# Patient Record
Sex: Female | Born: 1980 | Race: White | Hispanic: No | Smoking: Current every day smoker
Health system: Southern US, Community
[De-identification: ages and names within clinical notes are randomized; demographics above are authoritative.]

## PROBLEM LIST (undated history)

## (undated) DIAGNOSIS — R51 Headache: Secondary | ICD-10-CM

## (undated) DIAGNOSIS — M797 Fibromyalgia: Secondary | ICD-10-CM

## (undated) DIAGNOSIS — H539 Unspecified visual disturbance: Secondary | ICD-10-CM

## (undated) DIAGNOSIS — R519 Headache, unspecified: Secondary | ICD-10-CM

## (undated) HISTORY — PX: HEMANGIOMA EXCISION: SHX1734

## (undated) HISTORY — DX: Unspecified visual disturbance: H53.9

## (undated) HISTORY — PX: BACK SURGERY: SHX140

## (undated) HISTORY — DX: Headache, unspecified: R51.9

## (undated) HISTORY — PX: PARTIAL HYSTERECTOMY: SHX80

## (undated) HISTORY — DX: Fibromyalgia: M79.7

## (undated) HISTORY — DX: Headache: R51

---

## 2009-11-26 ENCOUNTER — Ambulatory Visit (HOSPITAL_COMMUNITY): Admission: RE | Admit: 2009-11-26 | Discharge: 2009-11-26 | Payer: Self-pay | Admitting: Neurosurgery

## 2010-12-08 LAB — SURGICAL PCR SCREEN: MRSA, PCR: NEGATIVE

## 2010-12-08 LAB — CBC
HCT: 32.8 % — ABNORMAL LOW (ref 36.0–46.0)
Hemoglobin: 10.8 g/dL — ABNORMAL LOW (ref 12.0–15.0)
MCV: 81.3 fL (ref 78.0–100.0)
Platelets: 194 10*3/uL (ref 150–400)
RDW: 13.4 % (ref 11.5–15.5)

## 2012-12-27 ENCOUNTER — Other Ambulatory Visit: Payer: Self-pay | Admitting: Neurosurgery

## 2012-12-27 DIAGNOSIS — M545 Low back pain: Secondary | ICD-10-CM

## 2012-12-27 DIAGNOSIS — M5417 Radiculopathy, lumbosacral region: Secondary | ICD-10-CM

## 2012-12-27 DIAGNOSIS — M47817 Spondylosis without myelopathy or radiculopathy, lumbosacral region: Secondary | ICD-10-CM

## 2013-01-12 ENCOUNTER — Ambulatory Visit
Admission: RE | Admit: 2013-01-12 | Discharge: 2013-01-12 | Disposition: A | Discharge status: Home / Self Care | Payer: Medicaid Other | Source: Ambulatory Visit | Attending: Neurosurgery | Admitting: Neurosurgery

## 2013-01-12 VITALS — BP 96/54 | HR 56 | Ht 68.0 in | Wt 115.0 lb

## 2013-01-12 DIAGNOSIS — M545 Low back pain: Secondary | ICD-10-CM

## 2013-01-12 DIAGNOSIS — M5417 Radiculopathy, lumbosacral region: Secondary | ICD-10-CM

## 2013-01-12 DIAGNOSIS — M47817 Spondylosis without myelopathy or radiculopathy, lumbosacral region: Secondary | ICD-10-CM

## 2013-01-12 MED ORDER — IOHEXOL 180 MG/ML  SOLN
17.0000 mL | Freq: Once | INTRAMUSCULAR | Status: AC | PRN
  Administered 2013-01-12: 17 mL via INTRATHECAL

## 2013-01-12 MED ORDER — DIAZEPAM 5 MG PO TABS
5.0000 mg | ORAL_TABLET | Freq: Once | ORAL | Status: AC
  Administered 2013-01-12: 5 mg via ORAL

## 2013-07-06 IMAGING — CT CT L SPINE W/ CM
4 of 10 series · 13 of 33 positions shown, 14 images · IV contrast (omnipaque)
Comparison: MR 07/03/2012 and earlier studies

Clincal data: Back and right lower extremity pain.  Previous disc
surgery.

LUMBAR MYELOGRAM
CT LUMBAR SPINE WITH INTRATHECAL CONTRAST
TECHNIQUE: An appropriate entry site was determined under
fluoroscopy. Operator donned sterile gloves and mask. Skin site was
marked, prepped with Betadine, and draped in usual sterile fashion,
and infiltrated locally with 1% lidocaine. A 22 gauge spinal needle
was  advanced into the thecal sac at L4 from a left interlaminar
approach. Clear colorless CSF returned. 17 ml Omnipaque 180 were
administered intrathecally for lumbar myelography, followed by
axial CT scanning of the lumbar spine. I personally performed the
lumbar puncture and administered the intrathecal contrast. I also
personally supervised acquisition of the myelogram images. Coronal
and sagittal reconstructions were generated from the axial scan.
Fluoroscopy time: 66seconds

[Series 2: l spine bone · axial · 0.27mm/px · z∈[-244,-166]mm · 2 of 95 slices shown, 3 images]
[im 32/95  soft-tissue]
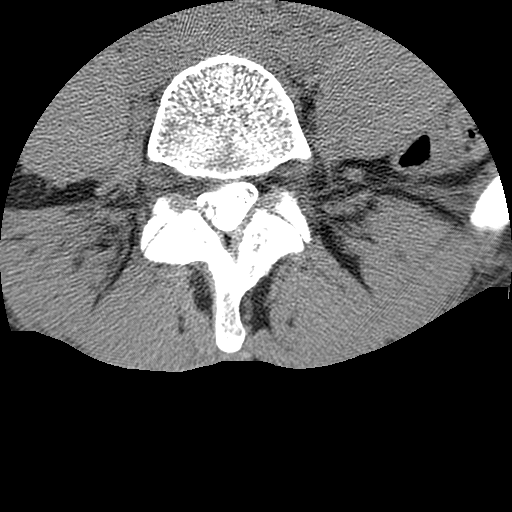
[im 32/95  bone]
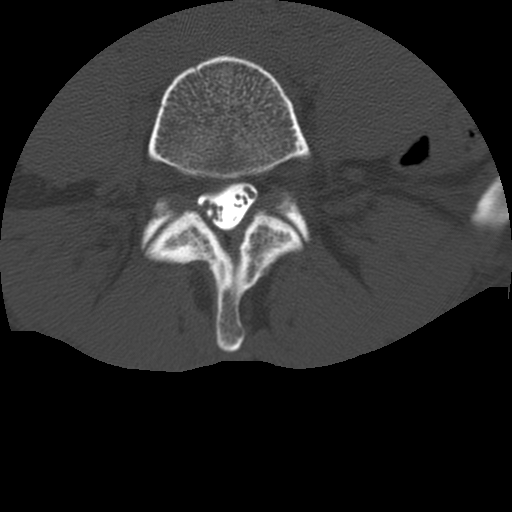
[im 63/95  bone]
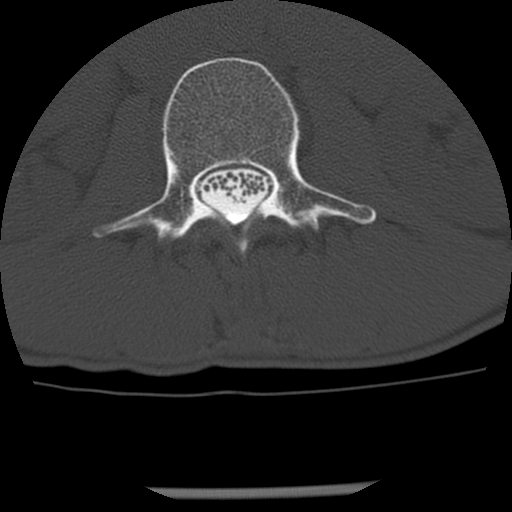

[Series 3: l spine soft · axial · 0.27mm/px · z∈[-264,-146]mm · 3 of 95 slices shown]
[im 24/95  soft-tissue]
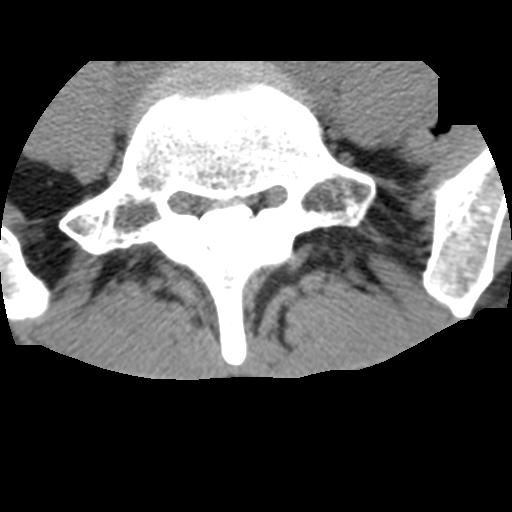
[im 48/95  soft-tissue]
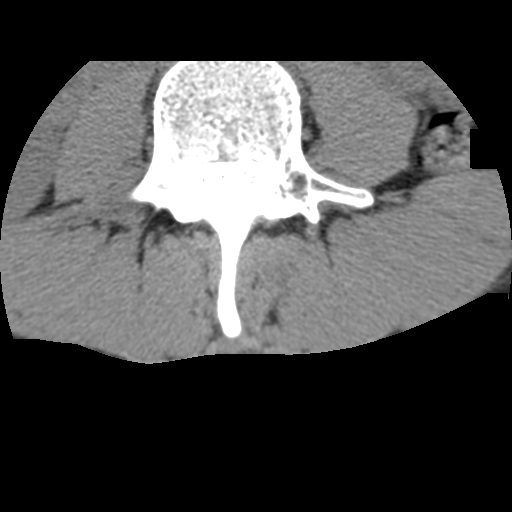
[im 71/95  soft-tissue]
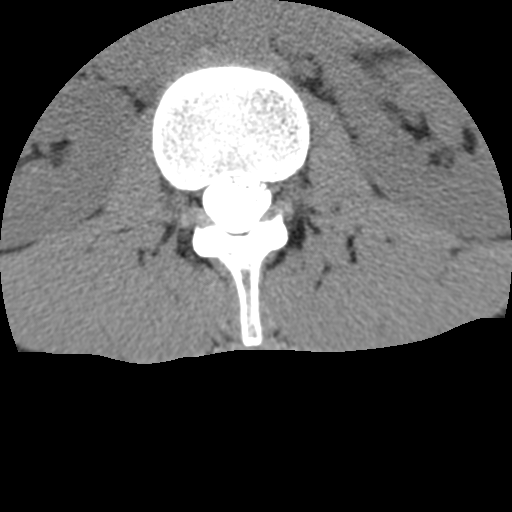

[Series 103: cor · coronal · 0.47mm/px · 5 of 44 slices shown]
[im 8/44  bone]
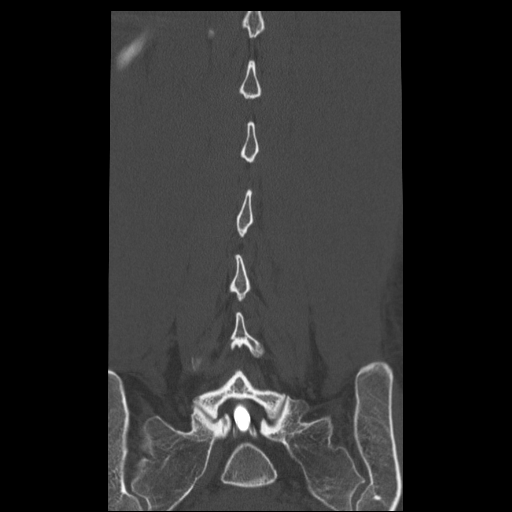
[im 15/44  bone]
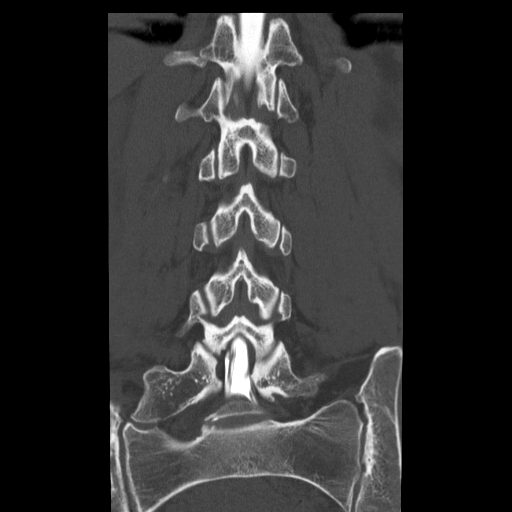
[im 22/44  bone]
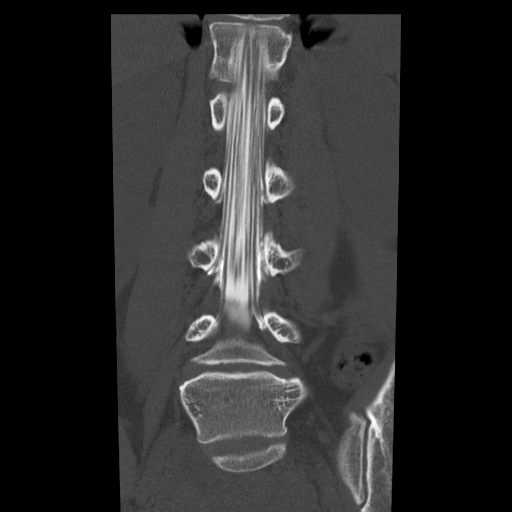
[im 29/44  bone]
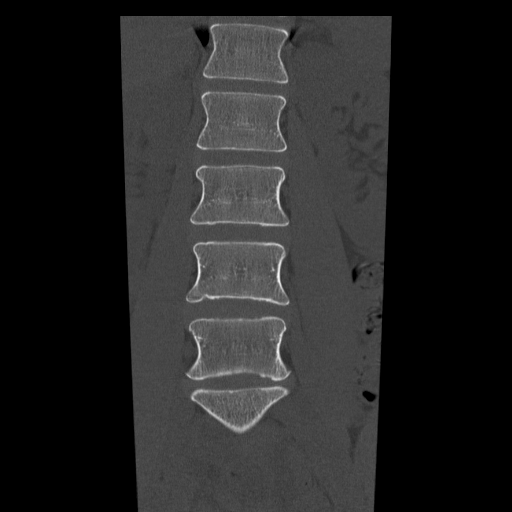
[im 36/44  bone]
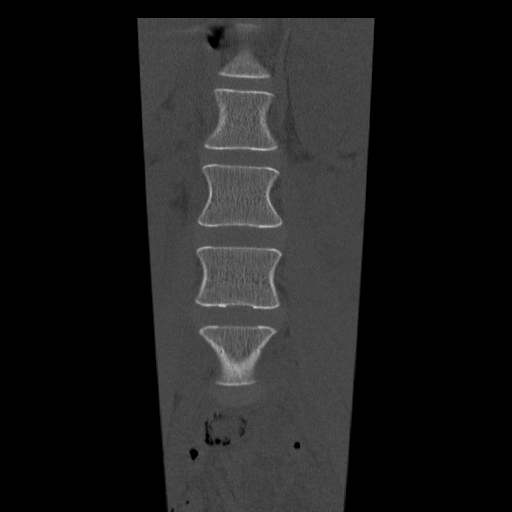

[Series 104: cor/lower · coronal · 0.27mm/px · 3 of 44 slices shown]
[im 9/44  bone]
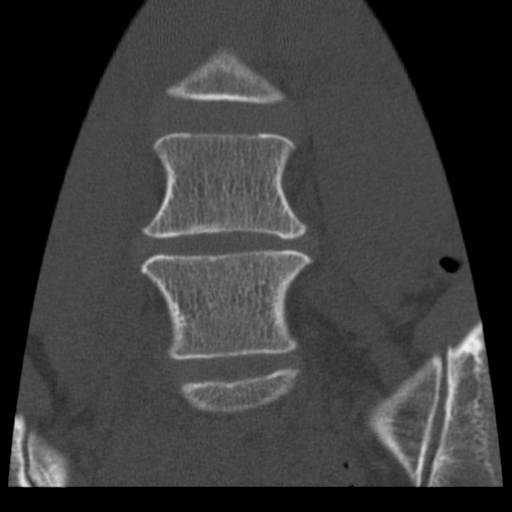
[im 18/44  bone]
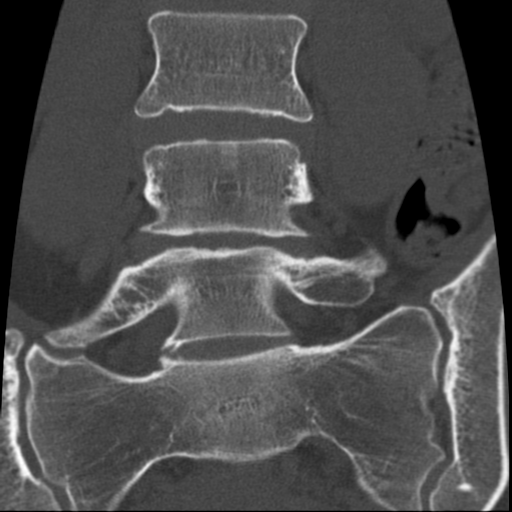
[im 26/44  bone]
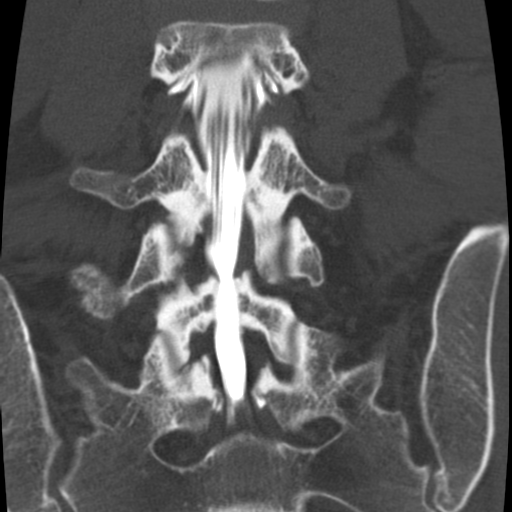

[13 of 33 positions shown; findings below may reference images not displayed]

FINDINGS: Numbering scheme follows that used on the prior exams.
Normal alignment.  No dynamic instability on its lateral flexion
and extension radiographs.

T12-L1:  Hypoplastic ribs at T12.  Interspace unremarkable.  Conus
terminates at the level of the interspace.  Central canal and
foramina patent.

L1-2: Unremarkable

L2-3: Unremarkable

L3-4: Mild disc bulge.  Central canal and foramina patent.

L4-5: Mild disc bulge and narrowing of the interspace.
Superimposed moderate right posterolateral  protrusion,
significantly displaces the proximal aspect of the right L5 nerve
root sleeve, and contributes to   right subarticular recess
stenosis.  Foramina remain patent.

L5-S1: Unremarkable interspace.  L5 is a transitional segment, the
enlarged right   transverse process articulating with the first
sacral segment.

Visualized paraspinal soft tissues unremarkable.  IUD noted.
IMPRESSION: 1. Moderate right posterolateral protrusion L4-5, with subarticular
recess stenosis and displacement of the proximal L5 nerve root
sleeve.

2.  Mild disc bulge L3-4 without compressive pathology.

## 2013-07-06 IMAGING — RF DG MYELOGRAM LUMBAR
14 of 16 series · 14 of 16 positions shown · IV contrast (omnipaque)
Comparison: MR 07/03/2012 and earlier studies

Clincal data: Back and right lower extremity pain.  Previous disc
surgery.

LUMBAR MYELOGRAM
CT LUMBAR SPINE WITH INTRATHECAL CONTRAST
TECHNIQUE: An appropriate entry site was determined under
fluoroscopy. Operator donned sterile gloves and mask. Skin site was
marked, prepped with Betadine, and draped in usual sterile fashion,
and infiltrated locally with 1% lidocaine. A 22 gauge spinal needle
was  advanced into the thecal sac at L4 from a left interlaminar
approach. Clear colorless CSF returned. 17 ml Omnipaque 180 were
administered intrathecally for lumbar myelography, followed by
axial CT scanning of the lumbar spine. I personally performed the
lumbar puncture and administered the intrathecal contrast. I also
personally supervised acquisition of the myelogram images. Coronal
and sagittal reconstructions were generated from the axial scan.
Fluoroscopy time: 66seconds

[Series 1: (hospital) · 1 of 1 slices shown]
[im 1/1]
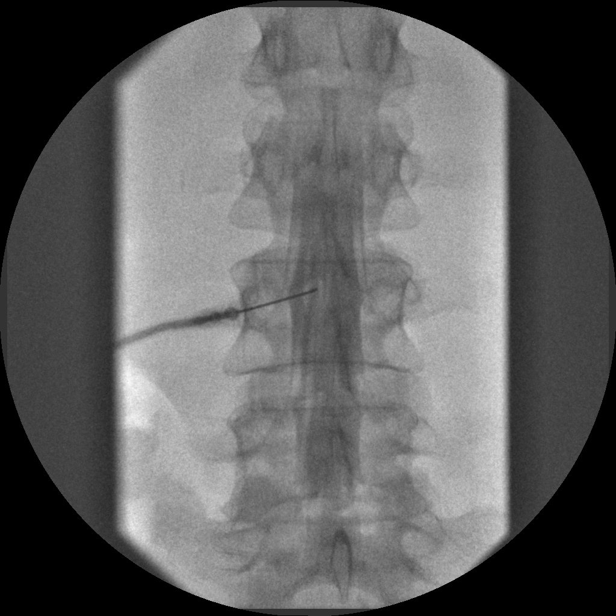

[Series 2: myelogram  white · 1 of 1 slices shown (1 of 10)]
[im 1/1]
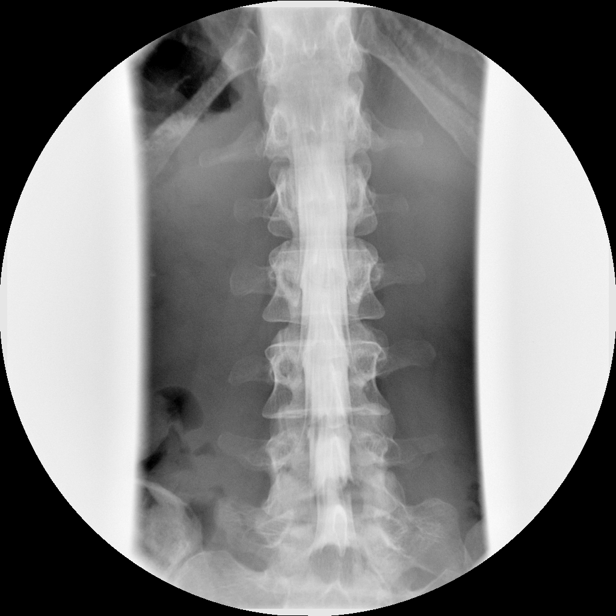

[Series 3: myelogram  white · 1 of 1 slices shown (2 of 10)]
[im 1/1]
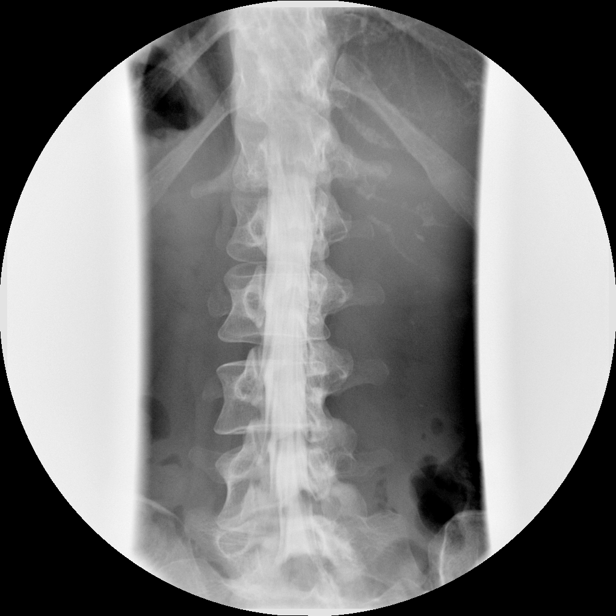

[Series 5: myelogram  white · 1 of 1 slices shown (3 of 10)]
[im 1/1]
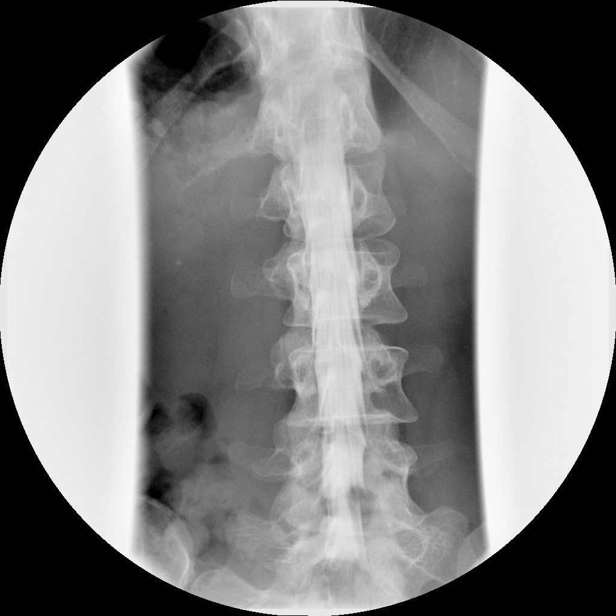

[Series 6: myelogram  white · 1 of 1 slices shown (4 of 10)]
[im 1/1]
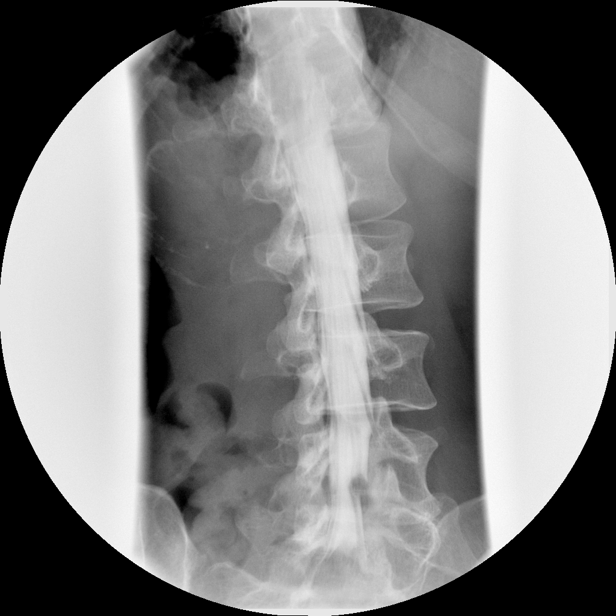

[Series 7: myelogram  white · 1 of 1 slices shown (5 of 10)]
[im 1/1]
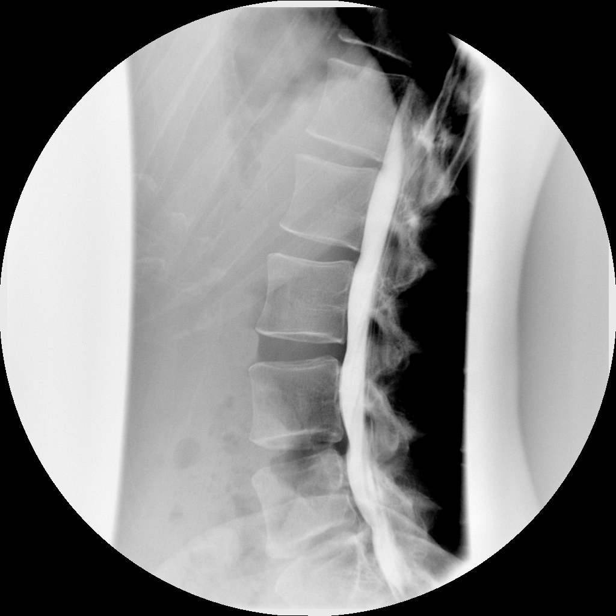

[Series 8: myelogram  white · 1 of 1 slices shown (6 of 10)]
[im 1/1]
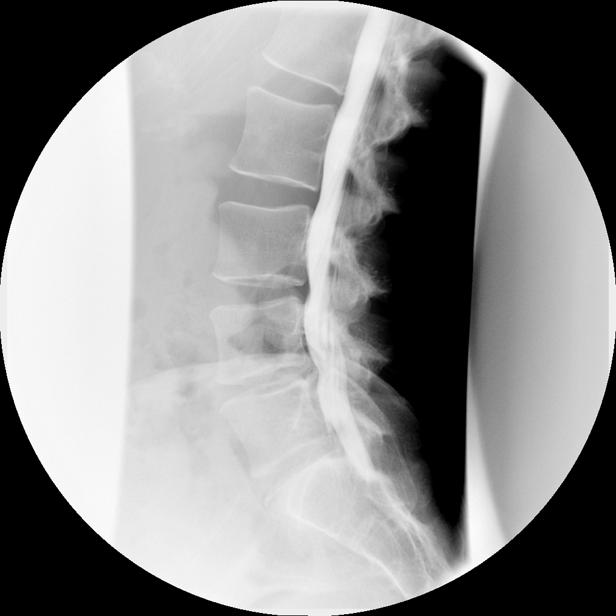

[Series 9: myelogram  white · 1 of 1 slices shown (7 of 10)]
[im 1/1]
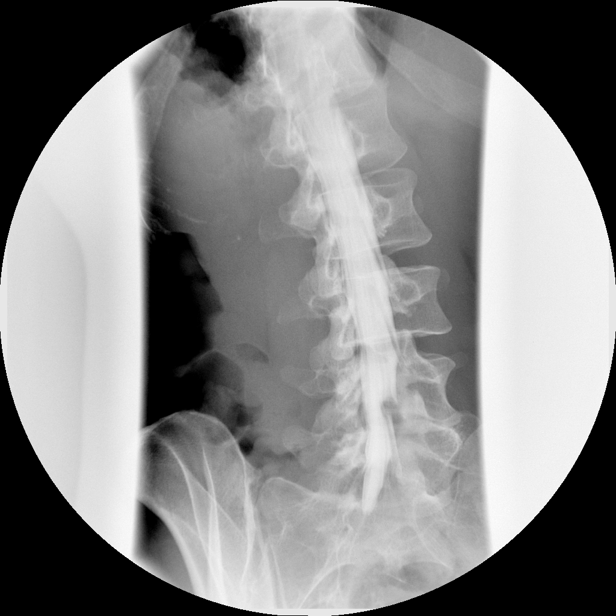

[Series 10: myelogram  white · 1 of 1 slices shown (8 of 10)]
[im 1/1]
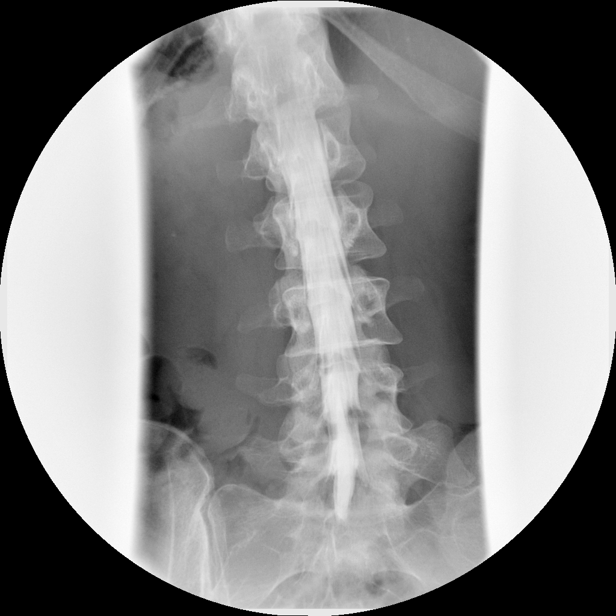

[Series 11: myelogram  white · 1 of 1 slices shown (9 of 10)]
[im 1/1]
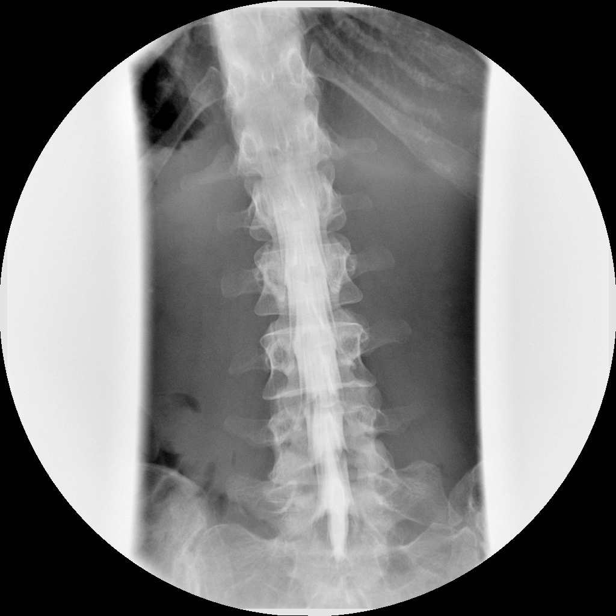

[Series 13: myelogram  white · 1 of 1 slices shown (10 of 10)]
[im 1/1]
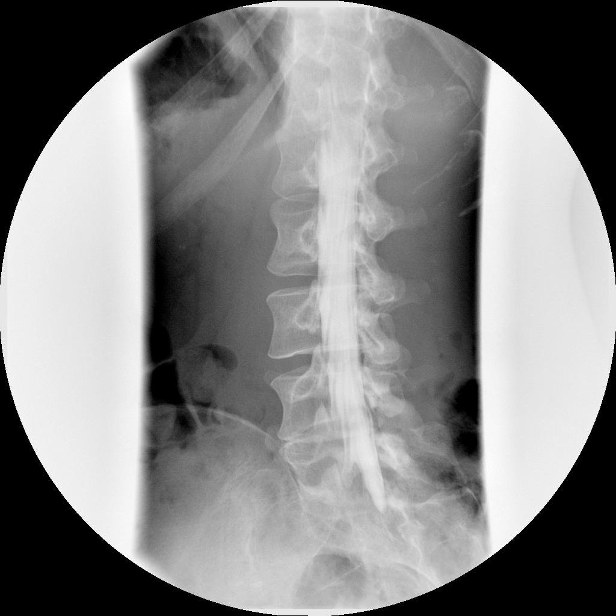

[Series 1001: view not recorded · 0.20mm/px · 1 of 1 slices shown (1 of 3)]
[im 1/1]
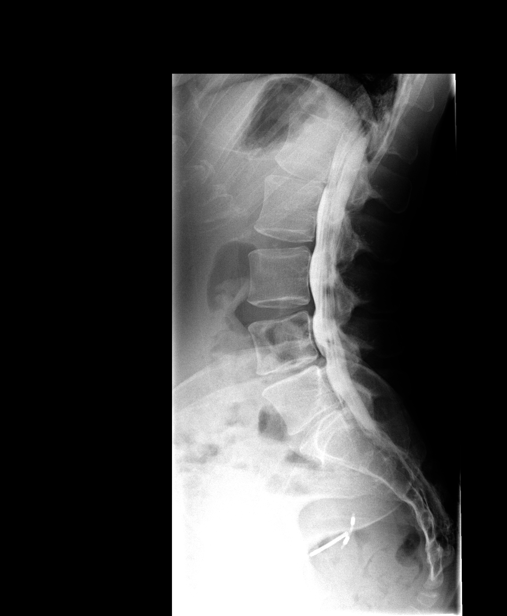

[Series 1002: view not recorded · 0.20mm/px · 1 of 1 slices shown (2 of 3)]
[im 1/1]
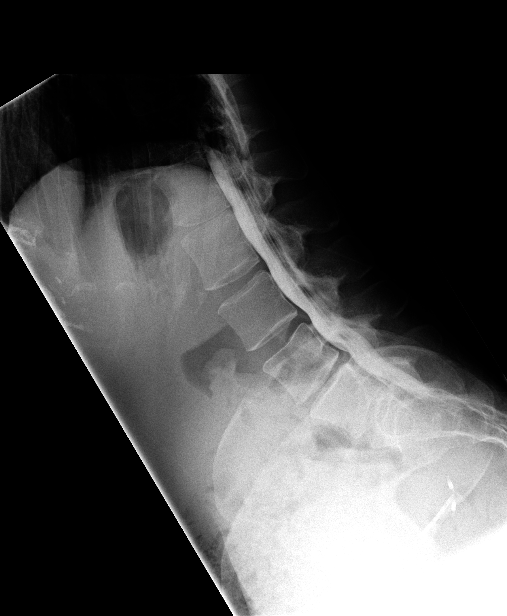

[Series 1003: view not recorded · 0.20mm/px · 1 of 1 slices shown (3 of 3)]
[im 1/1]
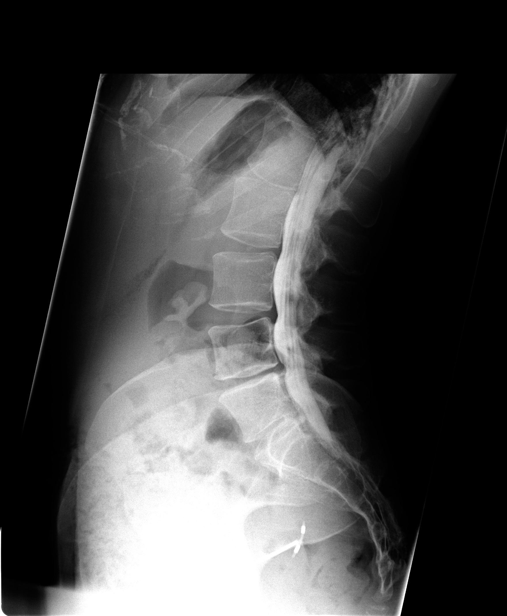

[14 of 16 positions shown; findings below may reference images not displayed]

FINDINGS: Numbering scheme follows that used on the prior exams.
Normal alignment.  No dynamic instability on its lateral flexion
and extension radiographs.

T12-L1:  Hypoplastic ribs at T12.  Interspace unremarkable.  Conus
terminates at the level of the interspace.  Central canal and
foramina patent.

L1-2: Unremarkable

L2-3: Unremarkable

L3-4: Mild disc bulge.  Central canal and foramina patent.

L4-5: Mild disc bulge and narrowing of the interspace.
Superimposed moderate right posterolateral  protrusion,
significantly displaces the proximal aspect of the right L5 nerve
root sleeve, and contributes to   right subarticular recess
stenosis.  Foramina remain patent.

L5-S1: Unremarkable interspace.  L5 is a transitional segment, the
enlarged right   transverse process articulating with the first
sacral segment.

Visualized paraspinal soft tissues unremarkable.  IUD noted.
IMPRESSION: 1. Moderate right posterolateral protrusion L4-5, with subarticular
recess stenosis and displacement of the proximal L5 nerve root
sleeve.

2.  Mild disc bulge L3-4 without compressive pathology.

## 2016-07-16 ENCOUNTER — Ambulatory Visit (INDEPENDENT_AMBULATORY_CARE_PROVIDER_SITE_OTHER): Payer: Medicaid Other | Admitting: Neurology

## 2016-07-16 ENCOUNTER — Encounter: Payer: Self-pay | Admitting: Neurology

## 2016-07-16 DIAGNOSIS — M797 Fibromyalgia: Secondary | ICD-10-CM

## 2016-07-16 DIAGNOSIS — I7300 Raynaud's syndrome without gangrene: Secondary | ICD-10-CM

## 2016-07-16 DIAGNOSIS — M25551 Pain in right hip: Secondary | ICD-10-CM | POA: Diagnosis not present

## 2016-07-16 DIAGNOSIS — G4700 Insomnia, unspecified: Secondary | ICD-10-CM | POA: Insufficient documentation

## 2016-07-16 DIAGNOSIS — R0683 Snoring: Secondary | ICD-10-CM | POA: Diagnosis not present

## 2016-07-16 DIAGNOSIS — F418 Other specified anxiety disorders: Secondary | ICD-10-CM

## 2016-07-16 DIAGNOSIS — G4719 Other hypersomnia: Secondary | ICD-10-CM | POA: Diagnosis not present

## 2016-07-16 MED ORDER — CYCLOBENZAPRINE HCL 5 MG PO TABS: 5.0000 mg | ORAL_TABLET | Freq: Three times a day (TID) | ORAL | 5 refills | Status: DC | PRN

## 2016-07-16 MED ORDER — CYCLOBENZAPRINE HCL 5 MG PO TABS: ORAL_TABLET | ORAL | 5 refills | Status: DC

## 2016-07-16 NOTE — Progress Notes (Signed)
[facility]  PATIENT: Jessica Gomez DOB: [birth year]  REFERRING DOCTOR OR PCP:   Dorothy Puffer, PA-C ([facility]) SOURCE: Patient, notes from Jessica Gomez, PA_C  _________________________________   HISTORICAL  CHIEF COMPLAINT:  Chief Complaint  Patient presents with  . Fibromyalgia    Jessica Gomez is here for eval of mult. sx.  Lower back pain--onset of pain 2010, had lumbar surgery by Dr. Hal Neer at [facility] n 2011. She c/o bilat hand pain, bilat lower leg pain.  She sees a Engineer, civil (consulting) for Raynaud's dz. Sts. she has left shoulder pain--hx. of bursitis. Sts. she had an injury at work, and workers comp sent her to a neurologist in [place] who she sts. 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 Back Pain    HISTORY OF PRESENT ILLNESS:  I saw your patient, Jessica Gomez, at [facility] for neurologic consultation.  She is a 35 year old woman with fibromyalgia, sleepiness, shoulder pain, hip pain and rRaynaud's syndrome.  FMS:   She was diagnosed with Fibromyalgia 35 years ago.   At that time, she presented with diffuse muscle aches. Specifically she reported pain in the muscles of the neck and upper back. There was also pain in the upper chest, arms and lower legs.. Initially she was placed on gabapentin and worked up to a dose of 600 mg by mouth 3 times a day. More recently she was switched from gabapentin to Lyrica 150 mg twice a day. Additionally early on she was placed on Cymbalta 30 mg. She was unable to tolerate higher dose many years ago. She then was placed back to 30 milligrams. Although those medications have helped her she continues to experience a lot of pain. She has also tried muscle relaxants. NSAIDs have helped the lower back pain but not pain elsewhere.  Sleep: She also reports sleep maintenance insomnia. In the past she had trouble falling asleep and staying asleep but now has much more difficulty staying asleep that she does  falling asleep. She finds that she wakes up multiple times at night due to pain. Early in the morning when she is supposed to get up she will often have difficulty waking up completely. She goes to bed around 9:30 PM and wakes up at 6:30 AM.    She snores.and has excessive daytime.   She wake sup gasping for air at times.     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   If they shake her, she usually becomes more alert and is able to get up.   She notes she often falls asleep right after eating.  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  She denies hypnagogic hallucinations.  Mood: She has had difficulty with depression and anxiety. She notes that she is very fatigued. She gets frustrated easily. Cymbalta is helping a little bit.  Shoulder pain:  In late 2016, she had the onset of left shoulder region pain. She was found to have bursitis and received a steroid shot with a lot of benefit. However, the rest of her pain persisted.    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  Right hip pain: The past she has a experience more pain in the right hip. He points to the lateral hip at the  trochanteric bursa where her pain is maximal pain increases sometimes if she sits a long time and with some activities.   She has not noted more pain further down the leg.  Raynaud's syndrome:  Due to is in her fingers and numbness intermittently in the hands she was diagnosed with Raynauld syndrome. She saw Dr. Gerilyn Nestle in [facility] in 2014. She had blood work at that time which was normal.  EPWORTH SLEEPINESS SCALE  On a scale of 0 - 3 what is the chance of dozing:  Sitting and Reading:   3 Watching TV:    3 Sitting inactive in a public place: 1 Passenger in car for one hour: 3 Lying down to rest in the  afternoon: 3 Sitting and talking to someone: 0 Sitting quietly after lunch:  3 In a car, stopped in traffic:  2  Total (out of 24):   18/24   (moderate sleepiness)     REVIEW OF SYSTEMS: Constitutional: No fevers, chills, sweats, or change in appetite.   She notes fatigue, insomnia, restless legs Eyes: No visual changes, double vision, eye pain Ear, nose and throat: No hearing loss, ear pain, nasal congestion, sore throat Cardiovascular: She reports chest pain, palpitations, leg swelling Respiratory: She reports shortness of breath and snoring  GastrointestinaI: No nausea, vomiting, diarrhea, abdominal pain, fecal incontinence Genitourinary: No dysuria, urinary retention or frequency.  No nocturia. Musculoskeletal: as above Integumentary: No rash,, skin lesions.  She notes itching. Neurological: as above Psychiatric: as above Endocrine: She reports falling hot or cold at times. She reports increased thirst. Hematologic/Lymphatic: No anemia, purpura, petechiae.    Allergic/Immunologic: No itchy/runny eyes, nasal congestion, recent allergic reactions, rashes  ALLERGIES: Allergies  Allergen Reactions  . Phenergan [Promethazine Hcl] Rash    HOME MEDICATIONS:  Current Outpatient Prescriptions:  .  DULoxetine (CYMBALTA) 30 MG capsule, Take 30 mg by mouth., Disp: , Rfl:  .  pregabalin (LYRICA) 150 MG capsule, Take 150 mg by mouth., Disp: , Rfl:   PAST MEDICAL HISTORY: Past Medical History:  Diagnosis Date  . Headache   . Vision abnormalities     PAST SURGICAL HISTORY: Past Surgical History:  Procedure Laterality Date  . BACK SURGERY    . HEMANGIOMA EXCISION    . PARTIAL HYSTERECTOMY      FAMILY HISTORY: Family History  Problem Relation Age of Onset  . Fibromyalgia Mother   . Arthritis/Rheumatoid Mother   . Kidney failure Father   . Hypertension Father   . Lupus Father     SOCIAL HISTORY:  Social History   Social History  . Marital status: Legally  Separated    Spouse name: N/A  . Number of children: N/A  . Years of education: N/A   Occupational History  . Not on file.   Social History Main Topics  . Smoking status: Current Every Day Smoker  . Smokeless tobacco: Never Used  . Alcohol use No  . Drug use: No  . Sexual activity: Not on file   Other Topics Concern  . Not on file   Social History Narrative  . No narrative on file     PHYSICAL EXAM  Vitals:   07/16/16 1335  BP: (!) 116/56  Pulse: 72  Resp: 14  Weight: 118 lb 8 oz (53.8 kg)  Height: '5\' 8"'  (1.727 m)    Body mass index is 18.02 kg/m.   General: The patient is well-developed and well-nourished and in no acute distress  Eyes:  Funduscopic exam  shows normal optic discs and retinal vessels.  Neck: The neck is supple, no carotid bruits are noted.  The neck is nontender.  Cardiovascular: The heart has a regular rate and rhythm with a normal S1 and S2. There were no murmurs, gallops or rubs. Lungs are clear to auscultation.  Skin: Extremities are without significant edema.  Musculoskeletal:  She is tender over many of the classic fibromyalgia tender points. She also has tenderness over the right trochanteric bursa.   Neurologic Exam  Mental status: The patient is alert and oriented x 3 at the time of the examination. The patient has apparent normal recent and remote memory, with an apparently normal attention span and concentration ability.   Speech is normal.  Cranial nerves: Extraocular movements are full. Pupils are equal, round, and reactive to light and accomodation.  Visual fields are full.  Facial symmetry is present. There is good facial sensation to soft touch bilaterally.Facial strength is normal.  Trapezius and sternocleidomastoid strength is normal. No dysarthria is noted.  The tongue is midline, and the patient has symmetric elevation of the soft palate. No obvious hearing deficits are noted.  Motor:  Muscle bulk is normal.   Tone is normal.  Strength is  5 / 5 in all 4 extremities.   Sensory: Sensory testing is intact to pinprick, soft touch and vibration sensation in all 4 extremities.  Coordination: Cerebellar testing reveals good finger-nose-finger and heel-to-shin bilaterally.  Gait and station: Station is normal.   Gait is normal. Tandem gait is normal. Romberg is negative.   Reflexes: Deep tendon reflexes are symmetric and 3 bilaterally.  There is no ankleclonus.   Plantar responses are flexor.    DIAGNOSTIC DATA (LABS, IMAGING, TESTING) - I reviewed patient records, labs, notes, testing and imaging myself where available.  Lab Results  Component Value Date   WBC 3.6 (L) 11/20/2009   HGB 10.8 (L) 11/20/2009   HCT 32.8 (L) 11/20/2009   MCV 81.3 11/20/2009   PLT 194 11/20/2009       ASSESSMENT AND PLAN  Fibromyalgia - Plan: Sedimentation rate, ANA w/Reflex, Cryoglobulin  Insomnia, unspecified type  Snoring - Plan: Nocturnal polysomnography  Excessive daytime sleepiness - Plan: Nocturnal polysomnography, Multiple sleep latency test  Right hip pain  Depression with anxiety  Raynaud's phenomenon without gangrene - Plan: Sedimentation rate, ANA w/Reflex, Cryoglobulin   In summary, Jessica Gomez is a 35 year old woman with fibromyalgia, right hip pain, Raynaud's syndrome, and excessive daytime sleepiness.     1.   Low dose Flexeril at bedtime has been shown to help some patients with fibromyalgia obtain better quality sleep and have less pain the next day.   She will start 5 mg nightly. 2.    Inject the right trochanteric bursa with 40 mg Depo-Medrol in 3 mL Marcaine using sterile technique. She tolerated the procedure well and there were no complications. 3.    Check ESR, ANA and cryoglobulins for the Reynard syndrome. 4.    Although her sleepiness related to her fibromyalgia and poor quality of sleep at night, she does report snoring and sleep paralysis some mornings. We will check a PSG to evaluate for  obstructive sleep apnea. If present, I recommend treatment with either CPAP or an oral appliance depending on the severity.  This will be followed by an MSLT to assess for narcolepsy and idiopathic hypersomnia.   If she has moderate or severe sleep apnea, the MSLT will be canceled. 5.    She will return  to see me in 2 months or sooner if there are new or worsening neurologic symptoms.  Thank you for asking me to see Jessica Gomez for a neurologic consultation. Please let me know if I can be of further assistance with her or other patients in the future.  Harden Bramer A. Felecia Shelling, MD, PhD 49/09/7913, 0:56 PM Certified in Neurology, Clinical Neurophysiology, Sleep Medicine, Pain Medicine and Neuroimaging  [facility] [address] [place], [place] [address] [phone]

## 2016-07-20 LAB — CRYOGLOBULIN

## 2016-07-20 LAB — SEDIMENTATION RATE: Sed Rate: 2 mm/hr (ref 0–32)

## 2016-07-20 LAB — ANA W/REFLEX: ANA: NEGATIVE

## 2016-07-21 ENCOUNTER — Telehealth: Payer: Self-pay | Admitting: *Deleted

## 2016-07-21 NOTE — Telephone Encounter (Signed)
LMOM that per RAS, labs done in our office are normal.  Will call with sleep study results once completed/fim

## 2016-07-21 NOTE — Telephone Encounter (Signed)
-----   Message from Asa Lenteichard A Sater, MD sent at 07/20/2016  4:52 PM EST ----- Please let her know that the blood work was normal. We will call her back a few days after her sleep study with those results

## 2016-12-21 ENCOUNTER — Telehealth: Payer: Self-pay

## 2016-12-21 NOTE — Telephone Encounter (Signed)
Called patient to schedule sleep study 3 times. Have not heard back.  

## 2018-02-21 ENCOUNTER — Institutional Professional Consult (permissible substitution): Payer: 59 | Admitting: Neurology

## 2018-03-09 ENCOUNTER — Ambulatory Visit: Payer: 59 | Admitting: Neurology

## 2018-03-09 ENCOUNTER — Telehealth: Payer: Self-pay | Admitting: *Deleted

## 2018-03-09 ENCOUNTER — Encounter: Payer: Self-pay | Admitting: Neurology

## 2018-03-09 VITALS — BP 123/74 | HR 84 | Resp 16 | Wt 116.0 lb

## 2018-03-09 DIAGNOSIS — F418 Other specified anxiety disorders: Secondary | ICD-10-CM

## 2018-03-09 DIAGNOSIS — I7300 Raynaud's syndrome without gangrene: Secondary | ICD-10-CM

## 2018-03-09 DIAGNOSIS — M25551 Pain in right hip: Secondary | ICD-10-CM

## 2018-03-09 DIAGNOSIS — G4719 Other hypersomnia: Secondary | ICD-10-CM | POA: Diagnosis not present

## 2018-03-09 DIAGNOSIS — M797 Fibromyalgia: Secondary | ICD-10-CM | POA: Diagnosis not present

## 2018-03-09 DIAGNOSIS — G4700 Insomnia, unspecified: Secondary | ICD-10-CM | POA: Diagnosis not present

## 2018-03-09 MED ORDER — AMITRIPTYLINE HCL 10 MG PO TABS: ORAL_TABLET | ORAL | 5 refills | Status: DC

## 2018-03-09 MED ORDER — ARMODAFINIL 200 MG PO TABS: ORAL_TABLET | ORAL | 5 refills | Status: DC

## 2018-03-09 NOTE — Progress Notes (Signed)
[facility]  PATIENT: Jessica SheehanKarrie A Gomez DOB: October 31, 1980  REFERRING DOCTOR OR PCP:   Resa Minerevyn Walke, PA-C ([facility]) SOURCE: Patient, notes from Ms. Gertie ExonWalke, PA_C  _________________________________   HISTORICAL  CHIEF COMPLAINT:  Chief Complaint  Patient presents with  . Headache    Numbness, tingling in hands/feet onset yrs ago, getting worse over the last year. Hx. of FMS, sts. no relief with Cymbalta and Lyrica.  Also c/o neck pain over the last 4-5 mos.  No known injury.  [facility].  Has had h/a's for years, but they are worse since neck has been hurting/fim  . Numbness    HISTORY OF PRESENT ILLNESS:  Cy Jessica Gomez is a 37 year old woman with fibromyalgia, sleepiness, shoulder pain, hip pain and rRaynaud's syndrome.  Update 03/09/2018: She feels she is doing about the same.   She has a long history of FMS and feels hte symptoms are unchanged.   She has muscle pain in the neck and hips.   She also reports tiredness.   She has tried Cymbalta, Lyrica and Lyrica without benefit. She has never been on amitriptyline or imipramine.    QHS cyclobenzaprine did not help.      She has numbness and tingling in her hands.    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    Naproxen has not helped the neck pain.     She has right hip pain off and on.  A trochanteric bursa injection helped x 6 months.   At the last visit she had blood work to evaluate her Raynaud's syndrome.  It was negative.  Symptoms persist, especially in colder temperatures.  She has insomnia and also had snoring.  She had a PSG 03/02/2018 which did not show any OSA.   She has a lot of fatigue and has excessive daytime sleepiness, often dozingf off after meals.     I personally reviewed laboratory and imaging reports.  I also personally reviewed the images from her recent cervical spine MRI (01/28/2018).   [facility]  [facility] mild  DJD.  [facility] is a small left paramedian protrusion and left foraminal narrowing but no nerve root compression  From 07/16/2016: FMS:   She was diagnosed with Fibromyalgia 5 years ago.   At that time, she presented with diffuse muscle aches. Specifically she reported pain in the muscles of the neck and upper back. There was also pain in the upper chest, arms and lower legs.. Initially she was placed on gabapentin and worked up to a dose of 600 mg by mouth 3 times a day. More recently she was switched from gabapentin to Lyrica 150 mg twice a day. Additionally early on she was placed on Cymbalta 30 mg. She was unable to tolerate higher dose many years ago. She then was placed back to 30 milligrams. Although those medications have helped her she continues to experience a lot of pain. She has also tried muscle relaxants. NSAIDs have helped the lower back pain but not pain elsewhere.  Sleep: She also reports sleep maintenance insomnia. In the past she had trouble falling asleep and staying asleep but now has much more difficulty staying asleep that she does falling asleep. She finds that she wakes up multiple times at night due to pain. Early in the morning when she is supposed to get up she will often have difficulty waking up completely.  She goes to bed around 9:30 PM and wakes up at 6:30 AM.    She snores.and has excessive daytime.   She wake sup gasping for air at times.     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   If they shake her, she usually becomes more alert and is able to get up.   She notes she often falls asleep right after eating.  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  She denies hypnagogic hallucinations.  Mood: She has had difficulty with depression and anxiety. She notes that she is very fatigued. She gets frustrated easily. Cymbalta is helping a little  bit.  Shoulder pain:  In late 2016, she had the onset of left shoulder region pain. She was found to have bursitis and received a steroid shot with a lot of benefit. However, the rest of her pain persisted.    When she saw a doctor in [place] for the [employer] she was told that she had scapular winging on the left.  Right hip pain: The past she has a experience more pain in the right hip. He points to the lateral hip at the trochanteric bursa where her pain is maximal pain increases sometimes if she sits a long time and with some activities.   She has not noted more pain further down the leg.  Raynaud's syndrome:  Due to is in her fingers and numbness intermittently in the hands she was diagnosed with Raynauld syndrome. She saw Dr. Sharmon Revere in [facility] in 2014. She had blood work at that time which was normal.  EPWORTH SLEEPINESS SCALE  On a scale of 0 - 3 what is the chance of dozing:  Sitting and Reading:   3 Watching TV:    3 Sitting inactive in a public place: 1 Passenger in car for one hour: 3 Lying down to rest in the afternoon: 3 Sitting and talking to someone: 0 Sitting quietly after lunch:  3 In a car, stopped in traffic:  2  Total (out of 24):   18/24   (moderate sleepiness)     REVIEW OF SYSTEMS: Constitutional: No fevers, chills, sweats, or change in appetite.   She notes fatigue, insomnia, restless legs Eyes: No visual changes, double vision, eye pain Ear, nose and throat: No hearing loss, ear pain, nasal congestion, sore throat Cardiovascular: She reports chest pain, palpitations, leg swelling Respiratory: She reports shortness of breath and snoring  GastrointestinaI: No nausea, vomiting, diarrhea, abdominal pain, fecal incontinence Genitourinary: No dysuria, urinary retention or frequency.  No nocturia. Musculoskeletal: as above Integumentary: No rash,, skin lesions.  She notes itching. Neurological: as above Psychiatric: as above Endocrine: She  reports falling hot or cold at times. She reports increased thirst. Hematologic/Lymphatic: No anemia, purpura, petechiae.    Allergic/Immunologic: No itchy/runny eyes, nasal congestion, recent allergic reactions, rashes  ALLERGIES: Allergies  Allergen Reactions  . Phenergan [Promethazine Hcl] Rash    HOME MEDICATIONS:  Current Outpatient Medications:  .  DULoxetine (CYMBALTA) 30 MG capsule, Take 30 mg by mouth., Disp: , Rfl:  .  pregabalin (LYRICA) 150 MG capsule, Take 150 mg by mouth., Disp: , Rfl:  .  amitriptyline (ELAVIL) 10 MG tablet, Take one or two at bedtime po, Disp: 60 tablet, Rfl: 5 .  Armodafinil 200 MG TABS, Take 1/2 to 1 po qAM, Disp: 30 tablet, Rfl:  5  PAST MEDICAL HISTORY: Past Medical History:  Diagnosis Date  . Fibromyalgia   . Headache   . Vision abnormalities     PAST SURGICAL HISTORY: Past Surgical History:  Procedure Laterality Date  . BACK SURGERY    . HEMANGIOMA EXCISION    . PARTIAL HYSTERECTOMY      FAMILY HISTORY: Family History  Problem Relation Age of Onset  . Fibromyalgia Mother   . Arthritis/Rheumatoid Mother   . Kidney failure Father   . Hypertension Father   . Lupus Father   . Fibromyalgia Sister   . Fibromyalgia Brother   . Epilepsy Brother     SOCIAL HISTORY:  Social History   Socioeconomic History  . Marital status: Legally Separated    Spouse name: Not on file  . Number of children: Not on file  . Years of education: Not on file  . Highest education level: Not on file  Occupational History  . Not on file  Social Needs  . Financial resource strain: Not on file  . Food insecurity:    Worry: Not on file    Inability: Not on file  . Transportation needs:    Medical: Not on file    Non-medical: Not on file  Tobacco Use  . Smoking status: Current Every Day Smoker  . Smokeless tobacco: Never Used  Substance and Sexual Activity  . Alcohol use: No  . Drug use: No  . Sexual activity: Not on file  Lifestyle  .  Physical activity:    Days per week: Not on file    Minutes per session: Not on file  . Stress: Not on file  Relationships  . Social connections:    Talks on phone: Not on file    Gets together: Not on file    Attends religious service: Not on file    Active member of club or organization: Not on file    Attends meetings of clubs or organizations: Not on file    Relationship status: Not on file  . Intimate partner violence:    Fear of current or ex partner: Not on file    Emotionally abused: Not on file    Physically abused: Not on file    Forced sexual activity: Not on file  Other Topics Concern  . Not on file  Social History Narrative  . Not on file     PHYSICAL EXAM  Vitals:   03/09/18 1007  BP: 123/74  Pulse: 84  Resp: 16  Weight: 116 lb (52.6 kg)  HC: 68" (172.7 cm)    Body mass index is 17.64 kg/m.   General: The patient is well-developed and well-nourished and in no acute distress   Neck: The neck is supple, no carotid bruits are noted.  The neck is nontender.  Cardiovascular: The heart has a regular rate and rhythm with a normal S1 and S2. There were no murmurs, gallops or rubs.   Skin: Extremities are without significant edema.  Musculoskeletal: She has tenderness over many of the classic fibromyalgia tender points of the upper chest, neck and proximal limbs.   Today's there is only mild tenderness over the trochanteric bursa on the right.  She does have some piriformis tenderness on the right.  Neurologic Exam  Mental status: The patient is alert and oriented x 3 at the time of the examination. The patient has apparent normal recent and remote memory, with an apparently normal attention span and concentration ability.   Speech is normal.  Cranial nerves: Extraocular movements are full.  Facial symmetry is present.  Facial strength and sensation is normal.  Trapezius strength is normal the tongue is midline, and the patient has symmetric elevation of the  soft palate. No obvious hearing deficits are noted.  Motor:  Muscle bulk is normal.   Tone is normal. Strength is  5 / 5 in all 4 extremities.   Sensory: Sensory testing is intact to pinprick, soft touch and vibration sensation in the arms but the tuning fork and touch sensation is 'tinglier' on the left.     Coordination: Cerebellar testing reveals good finger-nose-finger and heel-to-shin bilaterally.  Gait and station: Station is normal.  Gait is normal.  Tandem gait is normal.  Romberg is negative  Reflexes: Deep tendon reflexes are symmetric and increased bilaterally at 3 in arms and legs.  However, there is no spread at the knees or clonus at the ankles.  The plantar responses are normal.    DIAGNOSTIC DATA (LABS, IMAGING, TESTING) - I reviewed patient records, labs, notes, testing and imaging myself where available.  Lab Results  Component Value Date   WBC 3.6 (L) 11/20/2009   HGB 10.8 (L) 11/20/2009   HCT 32.8 (L) 11/20/2009   MCV 81.3 11/20/2009   PLT 194 11/20/2009       ASSESSMENT AND PLAN  Fibromyalgia  Insomnia, unspecified type  Excessive daytime sleepiness  Depression with anxiety  Raynaud's phenomenon without gangrene  Right hip pain   1.    Amitriptyline 10-20 mg at night to help with FMS pain, sleep, neuropathic pain 2.    Armodafinil for excessive daytime sleepines 3.    He try to exercise and stay active.   4.    She will return to see me in 6 months or sooner if there are new or worsening neurologic symptoms.   45-minute face-to-face evaluation with greater than one half the time counseling or coordinating care about her fibromyalgia pain, depression, sleepiness and other issues.  Omarion Minnehan A. Epimenio Foot, MD, PhD 03/09/2018, 5:29 PM Certified in Neurology, Clinical Neurophysiology, Sleep Medicine, Pain Medicine and Neuroimaging  [facility] [address] [place], [place] [address] [phone]

## 2018-03-09 NOTE — Telephone Encounter (Signed)
Initiated armodafinil 200mg  tab on covermymeds. Key: [id number]. In process of completing.

## 2018-03-10 NOTE — Telephone Encounter (Signed)
I tried calling number below. Number not in service. Unable to complete phone call

## 2018-03-10 NOTE — Telephone Encounter (Signed)
Benna DunksBarber with [facility] called stating that to speed up the process with the PA please call [phone]

## 2018-03-10 NOTE — Telephone Encounter (Signed)
PA submitted on covermymeds. Key: [id number] - PA Case ID: [id number] - Rx #: [id number]. Waiting on determination.   "Your information has been submitted to [employer]. If [employer] has not responded to your request within 24 hours, contact [employer] at [phone]".

## 2018-03-14 ENCOUNTER — Telehealth: Payer: Self-pay | Admitting: Neurology

## 2018-03-14 MED ORDER — ARMODAFINIL 200 MG PO TABS: ORAL_TABLET | ORAL | 5 refills | Status: DC

## 2018-03-14 NOTE — Telephone Encounter (Signed)
I spoke with [facility].  Armodafinil rx. was not filled.  I have cancelled it.  Rx. reprinted and [facility]

## 2018-03-14 NOTE — Telephone Encounter (Signed)
PA was denied. [facility] at [phone]. Received fax confirmation. Waiting on determination.

## 2018-03-14 NOTE — Telephone Encounter (Signed)
Pt requesting the prescription for Armodafinil 200 MG TABS be transferred to [facility].

## 2018-03-14 NOTE — Telephone Encounter (Signed)
Rx. faxed to [facility].  LMOM to make pt. aware/fim

## 2018-04-02 ENCOUNTER — Other Ambulatory Visit: Payer: Self-pay | Admitting: Neurology

## 2018-04-22 NOTE — Telephone Encounter (Signed)
I called [employer]. Appeal was denied. Case# [id number]. Review was completed on 03/22/18 and they advised letter was sent then with determination. They stated it was not medically necessary. She will resend denial to [phone]. Could take up to 48 hr to receive fax.

## 2018-05-12 ENCOUNTER — Other Ambulatory Visit: Payer: Self-pay | Admitting: *Deleted

## 2018-05-12 MED ORDER — ARMODAFINIL 250 MG PO TABS: 250.0000 mg | ORAL_TABLET | Freq: Every day | ORAL | 5 refills | Status: DC

## 2018-09-27 ENCOUNTER — Encounter: Payer: Self-pay | Admitting: Neurology

## 2018-09-27 ENCOUNTER — Telehealth: Payer: Self-pay | Admitting: Neurology

## 2018-09-27 ENCOUNTER — Ambulatory Visit: Payer: 59 | Admitting: Neurology

## 2018-09-27 VITALS — BP 110/68 | HR 91 | Ht 68.0 in | Wt 116.0 lb

## 2018-09-27 DIAGNOSIS — M5417 Radiculopathy, lumbosacral region: Secondary | ICD-10-CM

## 2018-09-27 DIAGNOSIS — G4719 Other hypersomnia: Secondary | ICD-10-CM

## 2018-09-27 DIAGNOSIS — Z9889 Other specified postprocedural states: Secondary | ICD-10-CM

## 2018-09-27 DIAGNOSIS — M797 Fibromyalgia: Secondary | ICD-10-CM | POA: Diagnosis not present

## 2018-09-27 MED ORDER — ARMODAFINIL 250 MG PO TABS: 250.0000 mg | ORAL_TABLET | Freq: Every day | ORAL | 1 refills | Status: DC

## 2018-09-27 MED ORDER — AMITRIPTYLINE HCL 10 MG PO TABS: ORAL_TABLET | ORAL | 3 refills | Status: DC

## 2018-09-27 NOTE — Progress Notes (Signed)
[facility]  PATIENT: Jessica Gomez DOB: [birth year]  REFERRING DOCTOR OR PCP:   Resa Miner, PA-C ([facility]) SOURCE: Patient, notes from [name], PA_C  _________________________________   HISTORICAL  CHIEF COMPLAINT:  Chief Complaint  Patient presents with  . Follow-up    RM 13, alone. Last seen 03/09/18. Needs refill on amitriptyline. [facility]. Pt reports she has had two episodes of drooling.     HISTORY OF PRESENT ILLNESS:  Jessica Gomez is a 38 year old woman with fibromyalgia, sleepiness, shoulder pain, hip pain and Raynaud's syndrome.  Update 09/27/2018:   She feels she is doing about the same in general.   She is getting swelling sensations in her back and reports more LBP.  Pain sometimes shoots down the left leg. 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  She has had a myelogram in 2014 and needed a blood patch.    She says her chiropractor told her that her back was dislocated because one leg is longer than the other.    She had an xray but I couldn't load hte pictures.    Her fibromyalgia pain in the muscles is doing better but the joint pain is doing worse.    Her shoulder pain improved but is now bothering her again.    She is not doing any exercise as she works most days.     She is doing better with daytime sleepiness since starting Nuvigil and sleeping better at night on Lyrica.     She is sleeping from 1130-7 am.      Update 03/09/2018: She feels she is doing about the same.   She has a long history of FMS and feels hte symptoms are unchanged.   She has muscle pain in the neck and hips.   She also reports tiredness.   She has tried Cymbalta, Lyrica and Lyrica without benefit. She has never been on amitriptyline or imipramine.    QHS cyclobenzaprine did not help.      She has numbness and tingling in her hands.    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    Naproxen  has not helped the neck pain.     She has right hip pain off and on.  A trochanteric bursa injection helped x 6 months.   At the last visit she had blood work to evaluate her Raynaud's syndrome.  It was negative.  Symptoms persist, especially in colder temperatures.  She has insomnia and also had snoring.  She had a PSG 03/02/2018 which did not show any OSA.   She has a lot of fatigue and has excessive daytime sleepiness, often dozingf off after meals.     I personally reviewed laboratory and imaging reports.  I also personally reviewed the images from her recent cervical spine MRI (01/28/2018).   At [facility] and C4C5 and C6C7  she has mild DJD.  At [facility] there is a small left paramedian protrusion and left foraminal narrowing but no nerve root compression  From 07/16/2016: FMS:   She was diagnosed with Fibromyalgia 5 years ago.   At that time, she presented with diffuse muscle aches. Specifically she reported pain in the muscles of the neck and upper back. There was also pain in the upper chest, arms and lower legs.. Initially she was placed on gabapentin and worked up to a dose of 600 mg by mouth 3 times  a day. More recently she was switched from gabapentin to Lyrica 150 mg twice a day. Additionally early on she was placed on Cymbalta 30 mg. She was unable to tolerate higher dose many years ago. She then was placed back to 30 milligrams. Although those medications have helped her she continues to experience a lot of pain. She has also tried muscle relaxants. NSAIDs have helped the lower back pain but not pain elsewhere.  Sleep: She also reports sleep maintenance insomnia. In the past she had trouble falling asleep and staying asleep but now has much more difficulty staying asleep that she does falling asleep. She finds that she wakes up multiple times at night due to pain. Early in the morning when she is supposed to get up she will often have difficulty waking up completely. She goes to bed around 9:30 PM and  wakes up at 6:30 AM.    She snores.and has excessive daytime.   She wake sup gasping for air at times.     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   If they shake her, she usually becomes more alert and is able to get up.   She notes she often falls asleep right after eating.  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  She denies hypnagogic hallucinations.  Mood: She has had difficulty with depression and anxiety. She notes that she is very fatigued. She gets frustrated easily. Cymbalta is helping a little bit.  Shoulder pain:  In late 2016, she had the onset of left shoulder region pain. She was found to have bursitis and received a steroid shot with a lot of benefit. However, the rest of her pain persisted.    When she saw a doctor in [place] the [employer] she was told that she had scapular winging on the left.  Right hip pain: The past she has a experience more pain in the right hip. He points to the lateral hip at the trochanteric bursa where her pain is maximal pain increases sometimes if she sits a long time and with some activities.   She has not noted more pain further down the leg.  Raynaud's syndrome:  Due to is in her fingers and numbness intermittently in the hands she was diagnosed with Raynauld syndrome. She saw Dr. Sharmon [place]vereZiolkowska [place] [facility] in [place]. She had blood work at that time which was normal.  EPWORTH SLEEPINESS SCALE  On a scale of 0 - 3 what is the chance of dozing:  Sitting and Reading:   3 Watching TV:    3 Sitting inactive in a public place: 1 Passenger in car for one hour: 3 Lying down to rest in the afternoon: 3 Sitting and talking to someone: 0 Sitting quietly after lunch:  3 In a car, stopped in traffic:  2  Total (out of 24):   18/24   (moderate sleepiness)     REVIEW OF SYSTEMS: Constitutional: No fevers, chills,  sweats, or change in appetite.   She notes fatigue, insomnia, restless legs Eyes: No visual changes, double vision, eye pain Ear, nose and throat: No hearing loss, ear pain, nasal congestion, sore throat Cardiovascular: She reports chest pain, palpitations, leg swelling Respiratory: She reports shortness of breath and snoring  GastrointestinaI: No nausea, vomiting, diarrhea, abdominal pain, fecal incontinence Genitourinary: No dysuria, urinary retention or frequency.  No  nocturia. Musculoskeletal: as above Integumentary: No rash,, skin lesions.  She notes itching. Neurological: as above Psychiatric: as above Endocrine: She reports falling hot or cold at times. She reports increased thirst. Hematologic/Lymphatic: No anemia, purpura, petechiae.    Allergic/Immunologic: No itchy/runny eyes, nasal congestion, recent allergic reactions, rashes  ALLERGIES: Allergies  Allergen Reactions  . Phenergan [Promethazine Hcl] Rash    HOME MEDICATIONS:  Current Outpatient Medications:  .  Armodafinil 250 MG tablet, Take 1 tablet (250 mg total) by mouth daily., Disp: 90 tablet, Rfl: 1 .  DULoxetine (CYMBALTA) 30 MG capsule, Take 30 mg by mouth., Disp: , Rfl:  .  pregabalin (LYRICA) 150 MG capsule, Take 150 mg by mouth., Disp: , Rfl:  .  amitriptyline (ELAVIL) 10 MG tablet, TAKE ONE OR TWO AT BEDTIME BY MOUTH, Disp: 180 tablet, Rfl: 3  PAST MEDICAL HISTORY: Past Medical History:  Diagnosis Date  . Fibromyalgia   . Headache   . Vision abnormalities     PAST SURGICAL HISTORY: Past Surgical History:  Procedure Laterality Date  . BACK SURGERY    . HEMANGIOMA EXCISION    . PARTIAL HYSTERECTOMY      FAMILY HISTORY: Family History  Problem Relation Age of Onset  . Fibromyalgia Mother   . Arthritis/Rheumatoid Mother   . Kidney failure Father   . Hypertension Father   . Lupus Father   . Fibromyalgia Sister   . Fibromyalgia Brother   . Epilepsy Brother     SOCIAL HISTORY:  Social  History   Socioeconomic History  . Marital status: Legally Separated    Spouse name: Not on file  . Number of children: Not on file  . Years of education: Not on file  . Highest education level: Not on file  Occupational History  . Not on file  Social Needs  . Financial resource strain: Not on file  . Food insecurity:    Worry: Not on file    Inability: Not on file  . Transportation needs:    Medical: Not on file    Non-medical: Not on file  Tobacco Use  . Smoking status: Current Every Day Smoker  . Smokeless tobacco: Never Used  Substance and Sexual Activity  . Alcohol use: No  . Drug use: No  . Sexual activity: Not on file  Lifestyle  . Physical activity:    Days per week: Not on file    Minutes per session: Not on file  . Stress: Not on file  Relationships  . Social connections:    Talks on phone: Not on file    Gets together: Not on file    Attends religious service: Not on file    Active member of club or organization: Not on file    Attends meetings of clubs or organizations: Not on file    Relationship status: Not on file  . Intimate partner violence:    Fear of current or ex partner: Not on file    Emotionally abused: Not on file    Physically abused: Not on file    Forced sexual activity: Not on file  Other Topics Concern  . Not on file  Social History Narrative  . Not on file     PHYSICAL EXAM  Vitals:   09/27/18 1113  BP: 110/68  Pulse: 91  Weight: 116 lb (52.6 kg)  Height: 5\' 8"  (1.727 m)    Body mass index is 17.64 kg/m.   General: The patient is well-developed and well-nourished and  in no acute distress  Musculoskeletal: She has tenderness over many of the classic fibromyalgia tender points of the neck, upper chest and back.  She has piriformis tenderness on the right Neurologic Exam  Mental status: The patient is alert and oriented x 3 at the time of the examination. The patient has apparent normal recent and remote memory, with an  apparently normal attention span and concentration ability.   Speech is normal.  Cranial nerves: Extraocular movements are full.  Facial symmetry is present.  Facial strength and sensation is normal.  Trapezius strength is normal  No obvious hearing deficits are noted.  Motor:  Muscle bulk is normal.   Tone is normal. Strength is  5 / 5 in all 4 extremities.   Sensory: Sensory testing is intact to pinprick, soft touch and vibration sensation in the arms but the tuning fork and touch sensation is 'tinglier' on the left.     Coordination: Cerebellar testing reveals good finger-nose-finger and heel-to-shin bilaterally.  Gait and station: Station is normal.  Gait and tandem gait are normal.  Romberg is negative  Reflexes: Deep tendon reflexes are symmetric and increased bilaterally at 3 in arms and legs.  However, there is no spread at the knees or clonus at the ankles.       DIAGNOSTIC DATA (LABS, IMAGING, TESTING) - I reviewed patient records, labs, notes, testing and imaging myself where available.  Lab Results  Component Value Date   WBC 3.6 (L) 11/20/2009   HGB 10.8 (L) 11/20/2009   HCT 32.8 (L) 11/20/2009   MCV 81.3 11/20/2009   PLT 194 11/20/2009       ASSESSMENT AND PLAN  Lumbosacral radiculopathy at L5 - Plan: MR Lumbar Spine W Wo Contrast, CANCELED: MR LUMBAR SPINE WO CONTRAST  Status post lumbar surgery - Plan: MR Lumbar Spine W Wo Contrast  Fibromyalgia  Excessive daytime sleepiness   1.    Renew Amitriptyline 10-20 mg at night to help with FMS pain, sleep, neuropathic pain 2.    Renew Armodafinil for excessive daytime sleepines 3.    MRI of the lumbar spine with and without contrast (history of L4-L5 surgery) to better determine the etiology of her pain.  Based on the results she may be a candidate for epidural steroid injections. 4.    She will return to see me in 6 months or sooner if there are new or worsening neurologic symptoms.   Richard A. Epimenio FootSater, MD, PhD  09/27/2018, 6:04 PM Certified in Neurology, Clinical Neurophysiology, Sleep Medicine, Pain Medicine and Neuroimaging  [facility] [address], [address] [place], [place] [address] [phone]

## 2018-09-27 NOTE — Telephone Encounter (Signed)
[employer] order sent to GI they obtain the auth and lvm for pt to be aware. I left GI phone number of [phone] and to give them a call if she has not heard from them in the next 2-3 business days.

## 2018-10-06 ENCOUNTER — Ambulatory Visit
Admission: RE | Admit: 2018-10-06 | Discharge: 2018-10-06 | Disposition: A | Discharge status: Home / Self Care | Payer: 59 | Source: Ambulatory Visit | Attending: Neurology | Admitting: Neurology

## 2018-10-06 DIAGNOSIS — Z9889 Other specified postprocedural states: Secondary | ICD-10-CM

## 2018-10-06 DIAGNOSIS — M5417 Radiculopathy, lumbosacral region: Secondary | ICD-10-CM | POA: Diagnosis not present

## 2018-10-06 MED ORDER — GADOBENATE DIMEGLUMINE 529 MG/ML IV SOLN
10.0000 mL | Freq: Once | INTRAVENOUS | Status: AC | PRN
  Administered 2018-10-06: 10 mL via INTRAVENOUS

## 2018-10-07 ENCOUNTER — Telehealth: Payer: Self-pay | Admitting: *Deleted

## 2018-10-07 DIAGNOSIS — M48061 Spinal stenosis, lumbar region without neurogenic claudication: Secondary | ICD-10-CM

## 2018-10-07 DIAGNOSIS — M5417 Radiculopathy, lumbosacral region: Secondary | ICD-10-CM

## 2018-10-07 DIAGNOSIS — Z9889 Other specified postprocedural states: Secondary | ICD-10-CM

## 2018-10-07 NOTE — Telephone Encounter (Signed)
-----   Message from Asa Lente, MD sent at 10/07/2018 10:59 AM EST ----- Please let her know that the MRI of the lumbar spine shows that there has been additional degenerative changes at L4-L5 that causes moderate spinal stenosis and could affect either of the L5 nerve roots.  I recommend that she be reevaluated by neurosurgery.

## 2018-10-07 NOTE — Telephone Encounter (Signed)
Left message requesting patient to call back.  Provided our number and office hours.

## 2018-10-07 NOTE — Telephone Encounter (Signed)
Attempted to reach patient again but no answer.  We will wait for her to call back.

## 2018-10-10 NOTE — Telephone Encounter (Signed)
Attempted to reach patient again and let another message requesting call back.  I have also left messages on the voicemails of her DPR contacts Jac Canavan, mother and Leda Roys, friend).  I requested them to have the patient return our call.

## 2018-10-10 NOTE — Addendum Note (Signed)
Addended by: Lindell Spar C on: 10/10/2018 02:10 PM   Modules accepted: Orders

## 2018-10-10 NOTE — Telephone Encounter (Signed)
Left a message this morning, requesting the patient to call us back.

## 2018-10-10 NOTE — Telephone Encounter (Signed)
The patient returned my call and her MRI results were discussed with her.  She is agreeable to see neurosurgery.  She would like her referral to be sent to [facility].

## 2019-01-25 ENCOUNTER — Telehealth: Payer: Self-pay | Admitting: *Deleted

## 2019-01-25 NOTE — Telephone Encounter (Signed)
Called pt. She agreed to doxy.me virtual visit for tomorrow. I emailed her link at [email or web address]. Asked her to call prior to appt if she does not receive email. Updated med list/pharmacy/allergies on file.

## 2019-01-26 ENCOUNTER — Other Ambulatory Visit: Payer: Self-pay

## 2019-01-26 ENCOUNTER — Encounter: Payer: Self-pay | Admitting: Neurology

## 2019-01-26 ENCOUNTER — Telehealth: Payer: Self-pay | Admitting: *Deleted

## 2019-01-26 ENCOUNTER — Ambulatory Visit (INDEPENDENT_AMBULATORY_CARE_PROVIDER_SITE_OTHER): Payer: 59 | Admitting: Neurology

## 2019-01-26 DIAGNOSIS — G4719 Other hypersomnia: Secondary | ICD-10-CM | POA: Diagnosis not present

## 2019-01-26 DIAGNOSIS — M797 Fibromyalgia: Secondary | ICD-10-CM | POA: Diagnosis not present

## 2019-01-26 DIAGNOSIS — M5417 Radiculopathy, lumbosacral region: Secondary | ICD-10-CM

## 2019-01-26 DIAGNOSIS — M48061 Spinal stenosis, lumbar region without neurogenic claudication: Secondary | ICD-10-CM

## 2019-01-26 DIAGNOSIS — G4700 Insomnia, unspecified: Secondary | ICD-10-CM | POA: Diagnosis not present

## 2019-01-26 MED ORDER — ARMODAFINIL 250 MG PO TABS: 250.0000 mg | ORAL_TABLET | Freq: Every day | ORAL | 1 refills | Status: DC

## 2019-01-26 MED ORDER — PREGABALIN 150 MG PO CAPS: 150.0000 mg | ORAL_CAPSULE | Freq: Three times a day (TID) | ORAL | 1 refills | Status: DC

## 2019-01-26 NOTE — Telephone Encounter (Signed)
[facility]ed [id number] [place], [place] - [address] [phone] (Phone) [phone] (Fax)   Spoke with Candice. Cx rx armodafinil sent today. Pt would like it to go to [facility] so she can use good rx coupon. I sent request to Dr. Epimenio Foot to escribe to [facility]

## 2019-01-26 NOTE — Progress Notes (Signed)
[facility]  PATIENT: Jessica Gomez DOB: Oct 18, 1980  REFERRING DOCTOR OR PCP:   Resa Minerevyn Walke, PA-C ([facility]) SOURCE: Patient, notes from Ms. Gertie ExonWalke, PA_C  _________________________________   HISTORICAL  CHIEF COMPLAINT:  Chief Complaint  Patient presents with  . Pain    HISTORY OF PRESENT ILLNESS:  Jessica Gomez is a 38 year old woman with fibromyalgia, sleepiness, shoulder pain, hip pain and Raynaud's syndrome.  Update 01/26/19: Virtual Visit via Video Note I connected with Jessica Gomez on 01/26/19 at 11:00 AM EDT by a video enabled telemedicine application and verified that I am speaking with the correct person.  I discussed the limitations of evaluation and management by telemedicine and the availability of in person appointments. The patient expressed understanding and agreed to proceed.  History of Present Illness: She is noting similar back and leg pain (L>R).  Pain is worse with walking.  Sitting can reduce the pain.  She had previously had surgery at L4L5 by Dr. Gerlene FeeKritzer many years ago.   She had trouble getting in with [facility] so her PCP sent her to Dr. Wyline MoodBranch and she was seen by Darel HongJennifer Gerard, PA-C.   An ESI was recommended but Jessica Gomez has never had good results with these.    MRI lumbar showed moderate spinal stenosis and bilateral lateral recess stenosis impinging othe L5 nerve roots.  IMPRESSION 10/07/2018: This MRI of the lumbar spine with and without contrast shows the following: 1.    There is a transitional lumbosacral vertebral body.   The same numbering scheme as the 01/12/2013 CT scan was used in this MRI study. 2.    Moderate spinal stenosis due to disc herniation, facet hypertrophy and ligamenta flava hypertrophy at L4-L5.  There is severe bilateral lateral recess stenosis that could lead to compression of either of the L5 nerve roots.  Additionally there is mild to moderate left foraminal narrowing.   Of note,  this has progressed compared to the 2014 CT myelogram when protruding disc was only towards the right and there was no left lateral recess or foraminal narrowing. 3.   Other degenerative changes at L3-L4 and L5-S1 that do not lead to nerve root compression. 4.   There is a normal enhancement pattern and no evidence of epidural fibrosis.  Her Fibromyalgia pain is doing worse.  She tolerates her medications well..  She has felt much more fatigued lately despite getting reasonably good sleep many nights    she also has anemia and will be getting an iron infusion soon.    SHe has left > right pain in the neck/shoulder/chest region.  She is on   Pregabalin (150 mg po bid), Cymbalta (60 mg qd), amitriptyline (20 mg qHS).   Muscle relaxants have never helped her much and cause somnolence.   She is doing some exercises.     She also has excessive daytime sleepiness, armodafinil helped at first but less so since her iron was low.  Vit D was normal in 2017 but not tested recently.       Observations/Objective: She is a well-developed well-nourished woman in no acute distress.  The head is normocephalic and atraumatic.  Sclera are anicteric.  Visible skin appears normal.  The neck has a good range of motion.     She is alert and fully oriented with fluent speech and good attention, knowledge and memory.  Extraocular muscles are intact.  Facial strength is normal.  She appears to have normal strength in  the arms.  Rapid alternating movements and finger-nose-finger are performed well.   Assessment and Plan: Fibromyalgia  Lumbosacral radiculopathy at L5  Excessive daytime sleepiness  Insomnia, unspecified type   1.  Increase Lyrica to 150 mg po tid.    2.  Continue Cymbalta, amitriptyline and Nuvigil. 3.  She will f/u with Dr. Verna Czech office in [place]. She needs to get MRI sent to them 4.  Do neck and back exercises. 5.  Vit D 2000 U daily.  She will f/u with hematology for infusion 6.  RTC 6 months,  sooner if new or worsening neurologic issues.  Follow Up Instructions: I discussed the assessment and treatment plan with the patient. The patient was provided an opportunity to ask questions and all were answered. The patient agreed with the plan and demonstrated an understanding of the instructions.    The patient was advised to call back or seek an in-person evaluation if the symptoms worsen or if the condition fails to improve as anticipated.  I provided 27 minutes of non-face-to-face time during this encounter.  _____________________________________ From previous visits Update 09/27/2018:   She feels she is doing about the same in general.   She is getting swelling sensations in her back and reports more LBP.  Pain sometimes shoots down the left leg. 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  She has had a myelogram in 2014 and needed a blood patch.    She says her chiropractor told her that her back was dislocated because one leg is longer than the other.    She had an xray but I couldn't load hte pictures.    Her fibromyalgia pain in the muscles is doing better but the joint pain is doing worse.    Her shoulder pain improved but is now bothering her again.    She is not doing any exercise as she works most days.     She is doing better with daytime sleepiness since starting Nuvigil and sleeping better at night on Lyrica.     She is sleeping from 1130-7 am.      Update 03/09/2018: She feels she is doing about the same.   She has a long history of FMS and feels hte symptoms are unchanged.   She has muscle pain in the neck and hips.   She also reports tiredness.   She has tried Cymbalta, Lyrica and Lyrica without benefit. She has never been on amitriptyline or imipramine.    QHS cyclobenzaprine did not help.      She has numbness and tingling in her hands.    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    Naproxen has not helped the neck pain.     She  has right hip pain off and on.  A trochanteric bursa injection helped x 6 months.   At the last visit she had blood work to evaluate her Raynaud's syndrome.  It was negative.  Symptoms persist, especially in colder temperatures.  She has insomnia and also had snoring.  She had a PSG 03/02/2018 which did not show any OSA.   She has a lot of fatigue and has excessive daytime sleepiness, often dozingf off after meals.     I personally reviewed laboratory and imaging reports.  I also personally reviewed the images from her recent cervical spine MRI (01/28/2018).   At [facility] and C4C5 and C6C7  she has mild DJD.  At [facility]  there is a small left paramedian protrusion and left foraminal narrowing but no nerve root compression  From 07/16/2016: FMS:   She was diagnosed with Fibromyalgia 5 years ago.   At that time, she presented with diffuse muscle aches. Specifically she reported pain in the muscles of the neck and upper back. There was also pain in the upper chest, arms and lower legs.. Initially she was placed on gabapentin and worked up to a dose of 600 mg by mouth 3 times a day. More recently she was switched from gabapentin to Lyrica 150 mg twice a day. Additionally early on she was placed on Cymbalta 30 mg. She was unable to tolerate higher dose many years ago. She then was placed back to 30 milligrams. Although those medications have helped her she continues to experience a lot of pain. She has also tried muscle relaxants. NSAIDs have helped the lower back pain but not pain elsewhere.  Sleep: She also reports sleep maintenance insomnia. In the past she had trouble falling asleep and staying asleep but now has much more difficulty staying asleep that she does falling asleep. She finds that she wakes up multiple times at night due to pain. Early in the morning when she is supposed to get up she will often have difficulty waking up completely. She goes to bed around 9:30 PM and wakes up at 6:30 AM.    She snores.and  has excessive daytime.   She wake sup gasping for air at times.     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   If they shake her, she usually becomes more alert and is able to get up.   She notes she often falls asleep right after eating.  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  She denies hypnagogic hallucinations.  Mood: She has had difficulty with depression and anxiety. She notes that she is very fatigued. She gets frustrated easily. Cymbalta is helping a little bit.  Shoulder pain:  In late 2016, she had the onset of left shoulder region pain. She was found to have bursitis and received a steroid shot with a lot of benefit. However, the rest of her pain persisted.    When she saw a doctor in [place] for the [employer] she was told that she had scapular winging on the left.  Right hip pain: The past she has a experience more pain in the right hip. He points to the lateral hip at the trochanteric bursa where her pain is maximal pain increases sometimes if she sits a long time and with some activities.   She has not noted more pain further down the leg.  Raynaud's syndrome:  Due to is in her fingers and numbness intermittently in the hands she was diagnosed with Raynauld syndrome. She saw Dr. Sharmon Revere in [facility] in 2014. She had blood work at that time which was normal.  EPWORTH SLEEPINESS SCALE  On a scale of 0 - 3 what is the chance of dozing:  Sitting and Reading:   3 Watching TV:    3 Sitting inactive in a public place: 1 Passenger in car for one hour: 3 Lying down to rest in the afternoon: 3 Sitting and talking to someone: 0 Sitting quietly after lunch:  3 In a car, stopped in traffic:  2  Total (out of 24):   18/24   (  moderate sleepiness)     REVIEW OF SYSTEMS: Constitutional: No fevers, chills, sweats, or change in appetite.   She  notes fatigue, insomnia, restless legs Eyes: No visual changes, double vision, eye pain Ear, nose and throat: No hearing loss, ear pain, nasal congestion, sore throat Cardiovascular: She reports chest pain, palpitations, leg swelling Respiratory: She reports shortness of breath and snoring  GastrointestinaI: No nausea, vomiting, diarrhea, abdominal pain, fecal incontinence Genitourinary: No dysuria, urinary retention or frequency.  No nocturia. Musculoskeletal: as above Integumentary: No rash,, skin lesions.  She notes itching. Neurological: as above Psychiatric: as above Endocrine: She reports falling hot or cold at times. She reports increased thirst. Hematologic/Lymphatic: No anemia, purpura, petechiae.    Allergic/Immunologic: No itchy/runny eyes, nasal congestion, recent allergic reactions, rashes  ALLERGIES: Allergies  Allergen Reactions  . Phenergan [Promethazine Hcl] Rash    HOME MEDICATIONS:  Current Outpatient Medications:  .  amitriptyline (ELAVIL) 10 MG tablet, TAKE ONE OR TWO AT BEDTIME BY MOUTH, Disp: 180 tablet, Rfl: 3 .  Armodafinil 250 MG tablet, Take 1 tablet (250 mg total) by mouth daily., Disp: 90 tablet, Rfl: 1 .  DULoxetine (CYMBALTA) 30 MG capsule, Take 60 mg by mouth daily. , Disp: , Rfl:  .  pregabalin (LYRICA) 150 MG capsule, Take 1 capsule (150 mg total) by mouth 3 (three) times daily., Disp: 270 capsule, Rfl: 1  PAST MEDICAL HISTORY: Past Medical History:  Diagnosis Date  . Fibromyalgia   . Headache   . Vision abnormalities     PAST SURGICAL HISTORY: Past Surgical History:  Procedure Laterality Date  . BACK SURGERY    . HEMANGIOMA EXCISION    . PARTIAL HYSTERECTOMY      FAMILY HISTORY: Family History  Problem Relation Age of Onset  . Fibromyalgia Mother   . Arthritis/Rheumatoid Mother   . Kidney failure Father   . Hypertension Father   . Lupus Father   . Fibromyalgia Sister   . Fibromyalgia Brother   . Epilepsy Brother      SOCIAL HISTORY:  Social History   Socioeconomic History  . Marital status: Legally Separated    Spouse name: Not on file  . Number of children: Not on file  . Years of education: Not on file  . Highest education level: Not on file  Occupational History  . Not on file  Social Needs  . Financial resource strain: Not on file  . Food insecurity:    Worry: Not on file    Inability: Not on file  . Transportation needs:    Medical: Not on file    Non-medical: Not on file  Tobacco Use  . Smoking status: Current Every Day Smoker  . Smokeless tobacco: Never Used  Substance and Sexual Activity  . Alcohol use: No  . Drug use: No  . Sexual activity: Not on file  Lifestyle  . Physical activity:    Days per week: Not on file    Minutes per session: Not on file  . Stress: Not on file  Relationships  . Social connections:    Talks on phone: Not on file    Gets together: Not on file    Attends religious service: Not on file    Active member of club or organization: Not on file    Attends meetings of clubs or organizations: Not on file    Relationship status: Not on file  . Intimate partner violence:    Fear of current or ex partner: Not on  file    Emotionally abused: Not on file    Physically abused: Not on file    Forced sexual activity: Not on file  Other Topics Concern  . Not on file  Social History Narrative  . Not on file     PHYSICAL EXAM  There were no vitals filed for this visit.  There is no height or weight on file to calculate BMI.   General: The patient is well-developed and well-nourished and in no acute distress  Musculoskeletal: She has tenderness over many of the classic fibromyalgia tender points of the neck, upper chest and back.  She has piriformis tenderness on the right Neurologic Exam  Mental status: The patient is alert and oriented x 3 at the time of the examination. The patient has apparent normal recent and remote memory, with an  apparently normal attention span and concentration ability.   Speech is normal.  Cranial nerves: Extraocular movements are full.  Facial symmetry is present.  Facial strength and sensation is normal.  Trapezius strength is normal  No obvious hearing deficits are noted.  Motor:  Muscle bulk is normal.   Tone is normal. Strength is  5 / 5 in all 4 extremities.   Sensory: Sensory testing is intact to pinprick, soft touch and vibration sensation in the arms but the tuning fork and touch sensation is 'tinglier' on the left.     Coordination: Cerebellar testing reveals good finger-nose-finger and heel-to-shin bilaterally.  Gait and station: Station is normal.  Gait and tandem gait are normal.  Romberg is negative  Reflexes: Deep tendon reflexes are symmetric and increased bilaterally at 3 in arms and legs.  However, there is no spread at the knees or clonus at the ankles.       DIAGNOSTIC DATA (LABS, IMAGING, TESTING) - I reviewed patient records, labs, notes, testing and imaging myself where available.  Lab Results  Component Value Date   WBC 3.6 (L) 11/20/2009   HGB 10.8 (L) 11/20/2009   HCT 32.8 (L) 11/20/2009   MCV 81.3 11/20/2009   PLT 194 11/20/2009       ASSESSMENT AND PLAN  Fibromyalgia  Lumbosacral radiculopathy at L5  Excessive daytime sleepiness  Insomnia, unspecified type   Malichi Palardy A. Epimenio Foot, MD, PhD 01/26/2019, 12:20 PM Certified in Neurology, Clinical Neurophysiology, Sleep Medicine, Pain Medicine and Neuroimaging  [facility] [address], [address] [place], [place] [address] [phone]

## 2019-01-30 ENCOUNTER — Telehealth: Payer: Self-pay | Admitting: *Deleted

## 2019-01-30 NOTE — Telephone Encounter (Signed)
Called pt. Scheduled 6 month f/u for 08/01/19 at 11am

## 2019-07-19 ENCOUNTER — Other Ambulatory Visit: Payer: Self-pay | Admitting: *Deleted

## 2019-07-19 MED ORDER — PREGABALIN 150 MG PO CAPS: 150.0000 mg | ORAL_CAPSULE | Freq: Three times a day (TID) | ORAL | 1 refills | Status: AC

## 2019-07-31 ENCOUNTER — Ambulatory Visit: Payer: Self-pay | Admitting: Neurology

## 2019-08-01 ENCOUNTER — Encounter: Payer: Self-pay | Admitting: Neurology

## 2019-08-01 ENCOUNTER — Ambulatory Visit: Payer: Self-pay | Admitting: Neurology

## 2019-08-23 ENCOUNTER — Other Ambulatory Visit: Payer: Self-pay | Admitting: Neurology

## 2019-09-06 ENCOUNTER — Other Ambulatory Visit: Payer: Self-pay | Admitting: Neurology

## 2019-09-06 MED ORDER — ARMODAFINIL 250 MG PO TABS: 250.0000 mg | ORAL_TABLET | Freq: Every day | ORAL | 0 refills | Status: DC

## 2019-09-06 NOTE — Telephone Encounter (Signed)
1) Medication(s) Requested (by name): Armodafinil 250 MG tablet   2) Pharmacy of Choice: [facility] [place], [place] - [address]  [address] [place] [address]

## 2019-09-06 NOTE — Addendum Note (Signed)
Addended by: Hope Pigeon on: 09/06/2019 01:24 PM   Modules accepted: Orders

## 2019-09-06 NOTE — Telephone Encounter (Signed)
Pt called in and stated that Jessica Gomez doesn't take the GoodRx on Armodafinil and she needs it sent to   [place], [place] - [address]

## 2019-09-06 NOTE — Telephone Encounter (Signed)
Called [facility] and cx rx armodafinil sent there. Advised we are sending to different pharmacy. They cx.

## 2019-11-23 ENCOUNTER — Telehealth: Payer: Self-pay | Admitting: Neurology

## 2019-11-23 ENCOUNTER — Other Ambulatory Visit: Payer: Self-pay

## 2019-11-23 ENCOUNTER — Ambulatory Visit: Payer: 59 | Admitting: Neurology

## 2019-11-23 ENCOUNTER — Encounter: Payer: Self-pay | Admitting: Neurology

## 2019-11-23 VITALS — BP 114/61 | HR 73 | Temp 98.0°F | Ht 68.0 in | Wt 121.0 lb

## 2019-11-23 DIAGNOSIS — Z9889 Other specified postprocedural states: Secondary | ICD-10-CM

## 2019-11-23 DIAGNOSIS — R29898 Other symptoms and signs involving the musculoskeletal system: Secondary | ICD-10-CM

## 2019-11-23 DIAGNOSIS — M542 Cervicalgia: Secondary | ICD-10-CM

## 2019-11-23 DIAGNOSIS — M797 Fibromyalgia: Secondary | ICD-10-CM | POA: Diagnosis not present

## 2019-11-23 DIAGNOSIS — G4719 Other hypersomnia: Secondary | ICD-10-CM

## 2019-11-23 DIAGNOSIS — M5412 Radiculopathy, cervical region: Secondary | ICD-10-CM | POA: Insufficient documentation

## 2019-11-23 MED ORDER — MELOXICAM 7.5 MG PO TABS: 7.5000 mg | ORAL_TABLET | Freq: Every day | ORAL | 11 refills | Status: AC

## 2019-11-23 NOTE — Telephone Encounter (Signed)
[employer] order sent to [facility] they will obtain the auth and reach out to the patient to schedule.

## 2019-11-23 NOTE — Progress Notes (Signed)
[facility]  PATIENT: Jessica SheehanKarrie A Gomez DOB: [birth year]  REFERRING DOCTOR OR PCP:   Resa Minerevyn Walke, PA-C ([facility]) SOURCE: Patient, notes from Ms. Gertie ExonWalke, PA_C  _________________________________   HISTORICAL  CHIEF COMPLAINT:  Chief Complaint  Patient presents with  . Follow-up    RM 12, alone. Mother, Alvino Chapelllen. Last seen 01/26/2019. Pt reports her hands/neck are worse within the last month. Has severe foot swelling. Memory/focus worse.     HISTORY OF PRESENT ILLNESS:  Jessica BlamerKarrie Gomez is a 39 year old woman with fibromyalgia, sleepiness, shoulder pain, hip pain and Raynaud's syndrome.  Update 11/23/2019: She reports more neck pain.  Raising her arms is more painful.     She notes the worse pain near her collarbone on the left and in hands and feet but feels she has some pain all over.     She has back and leg pain and a sensation of blistering in her feet.  She had surgery in 2014 by Dr. Heath LarkKritzker at [facility].   She has no recent ESI but none in past year.   Lumbar ESI had not helped.  She never had cervical spine ESI.       For Fibromyalgia and dysaesthesias, she is on Cymbalta and Lyrica.    She reports a mental fog and Nuvigil helps her some.  She has had more trouble with her memory.   She has trouble waking up in the morning and needs help to get out of bed.   Her legs give way in the mornings when she gets out of bed.     She does not wake up at night and gets 8 hours sleep.    Cervical spine MRI 01/28/2018:  IMPRESSION: 1. Small left paracentral disc protrusion at C5-6 potentially affecting the left C7 nerve root. 2. Mild spondylosis elsewhere without stenosis.  Update 01/26/19 (virtual) She is noting similar back and leg pain (L>R).  Pain is worse with walking.  Sitting can reduce the pain.  She had previously had surgery at [facility] by Dr. Gerlene FeeKritzer many years ago.   She had trouble getting in with [facility] so her PCP sent her to Dr. Wyline MoodBranch and  she was seen by Darel HongJennifer Gerard, PA-C.   An ESI was recommended but Jessica Gomez has never had good results with these.    MRI lumbar showed moderate spinal stenosis and bilateral lateral recess stenosis impinging othe L5 nerve roots.  IMPRESSION 10/07/2018: This MRI of the lumbar spine with and without contrast shows the following: 1.    There is a transitional lumbosacral vertebral body.   The same numbering scheme as the 01/12/2013 CT scan was used in this MRI study. 2.    Moderate spinal stenosis due to disc herniation, facet hypertrophy and ligamenta flava hypertrophy at L4-L5.  There is severe bilateral lateral recess stenosis that could lead to compression of either of the L5 nerve roots.  Additionally there is mild to moderate left foraminal narrowing.   Of note, this has progressed compared to the 2014 CT myelogram when protruding disc was only towards the right and there was no left lateral recess or foraminal narrowing. 3.   Other degenerative changes at L3-L4 and L5-S1 that do not lead to nerve root compression. 4.   There is a normal enhancement pattern and no evidence of epidural fibrosis.  Her Fibromyalgia pain is doing worse.  She tolerates her medications well..  She has felt much more fatigued lately despite getting reasonably good sleep  many nights    she also has anemia and will be getting an iron infusion soon.    SHe has left > right pain in the neck/shoulder/chest region.  She is on   Pregabalin (150 mg po bid), Cymbalta (60 mg qd), amitriptyline (20 mg qHS).   Muscle relaxants have never helped her much and cause somnolence.   She is doing some exercises.     She also has excessive daytime sleepiness, armodafinil helped at first but less so since her iron was low.  Vit D was normal in 2017 but not tested recently.        Update 09/27/2018:   She feels she is doing about the same in general.   She is getting swelling sensations in her back and reports more LBP.  Pain sometimes shoots  down the left leg. She has had surgery at [facility] [employer]).  She has had a myelogram in 2014 and needed a blood patch.    She says her chiropractor told her that her back was dislocated because one leg is longer than the other.    She had an xray but I couldn't load hte pictures.    Her fibromyalgia pain in the muscles is doing better but the joint pain is doing worse.    Her shoulder pain improved but is now bothering her again.    She is not doing any exercise as she works most days.     She is doing better with daytime sleepiness since starting Nuvigil and sleeping better at night on Lyrica.     She is sleeping from 1130-7 am.      Update 03/09/2018: She feels she is doing about the same.   She has a long history of FMS and feels hte symptoms are unchanged.   She has muscle pain in the neck and hips.   She also reports tiredness.   She has tried Cymbalta, Lyrica and Lyrica without benefit. She has never been on amitriptyline or imipramine.    QHS cyclobenzaprine did not help.      She has numbness and tingling in her hands.    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    Naproxen has not helped the neck pain.     She has right hip pain off and on.  A trochanteric bursa injection helped x 6 months.   At the last visit she had blood work to evaluate her Raynaud's syndrome.  It was negative.  Symptoms persist, especially in colder temperatures.  She has insomnia and also had snoring.  She had a PSG 03/02/2018 which did not show any OSA.   She has a lot of fatigue and has excessive daytime sleepiness, often dozingf off after meals.     I personally reviewed laboratory and imaging reports.  I also personally reviewed the images from her recent cervical spine MRI (01/28/2018).   At [facility]  she has mild DJD.  At [facility] there is a small left paramedian protrusion and left foraminal narrowing but no nerve root compression  From 07/16/2016: FMS:   She  was diagnosed with Fibromyalgia 5 years ago.   At that time, she presented with diffuse muscle aches. Specifically she reported pain in the muscles of the neck and upper back. There was also pain in the upper chest, arms and lower legs.. Initially she was placed on gabapentin and worked up to a dose of 600  mg by mouth 3 times a day. More recently she was switched from gabapentin to Lyrica 150 mg twice a day. Additionally early on she was placed on Cymbalta 30 mg. She was unable to tolerate higher dose many years ago. She then was placed back to 30 milligrams. Although those medications have helped her she continues to experience a lot of pain. She has also tried muscle relaxants. NSAIDs have helped the lower back pain but not pain elsewhere.  Sleep: She also reports sleep maintenance insomnia. In the past she had trouble falling asleep and staying asleep but now has much more difficulty staying asleep that she does falling asleep. She finds that she wakes up multiple times at night due to pain. Early in the morning when she is supposed to get up she will often have difficulty waking up completely. She goes to bed around 9:30 PM and wakes up at 6:30 AM.    She snores.and has excessive daytime.   She wake sup gasping for air at times.     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   If they shake her, she usually becomes more alert and is able to get up.   She notes she often falls asleep right after eating.  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  She denies hypnagogic hallucinations.  Mood: She has had difficulty with depression and anxiety. She notes that she is very fatigued. She gets frustrated easily. Cymbalta is helping a little bit.  Shoulder pain:  In late 2016, she had the onset of left shoulder region pain. She was found to have bursitis and received a steroid shot with  a lot of benefit. However, the rest of her pain persisted.    When she saw a doctor in [place] for the [employer] she was told that she had scapular winging on the left.  Right hip pain: The past she has a experience more pain in the right hip. He points to the lateral hip at the trochanteric bursa where her pain is maximal pain increases sometimes if she sits a long time and with some activities.   She has not noted more pain further down the leg.  Raynaud's syndrome:  Due to is in her fingers and numbness intermittently in the hands she was diagnosed with Raynauld syndrome. She saw Dr. Sharmon Revere in [facility] in 2014. She had blood work at that time which was normal.  EPWORTH SLEEPINESS SCALE  On a scale of 0 - 3 what is the chance of dozing:  Sitting and Reading:   3 Watching TV:    3 Sitting inactive in a public place: 1 Passenger in car for one hour: 3 Lying down to rest in the afternoon: 3 Sitting and talking to someone: 0 Sitting quietly after lunch:  3 In a car, stopped in traffic:  2  Total (out of 24):   18/24   (moderate sleepiness)     REVIEW OF SYSTEMS: Constitutional: No fevers, chills, sweats, or change in appetite.   She notes fatigue, insomnia, restless legs Eyes: No visual changes, double vision, eye pain Ear, nose and throat: No hearing loss, ear pain, nasal congestion, sore throat Cardiovascular: She reports chest pain, palpitations, leg swelling Respiratory: She reports shortness of breath and snoring  GastrointestinaI: No nausea, vomiting, diarrhea, abdominal pain, fecal incontinence Genitourinary: No dysuria, urinary retention  or frequency.  No nocturia. Musculoskeletal: as above Integumentary: No rash,, skin lesions.  She notes itching. Neurological: as above Psychiatric: as above Endocrine: She reports falling hot or cold at times. She reports increased thirst. Hematologic/Lymphatic: No anemia, purpura, petechiae.    Allergic/Immunologic: No  itchy/runny eyes, nasal congestion, recent allergic reactions, rashes  ALLERGIES: Allergies  Allergen Reactions  . Phenergan [Promethazine Hcl] Rash    HOME MEDICATIONS:  Current Outpatient Medications:  .  Armodafinil 250 MG tablet, Take 1 tablet (250 mg total) by mouth daily., Disp: 90 tablet, Rfl: 0 .  DULoxetine (CYMBALTA) 30 MG capsule, Take 60 mg by mouth daily. , Disp: , Rfl:  .  pregabalin (LYRICA) 150 MG capsule, Take 1 capsule (150 mg total) by mouth 3 (three) times daily., Disp: 270 capsule, Rfl: 1 .  meloxicam (MOBIC) 7.5 MG tablet, Take 1 tablet (7.5 mg total) by mouth daily., Disp: 30 tablet, Rfl: 11  PAST MEDICAL HISTORY: Past Medical History:  Diagnosis Date  . Fibromyalgia   . Headache   . Vision abnormalities     PAST SURGICAL HISTORY: Past Surgical History:  Procedure Laterality Date  . BACK SURGERY    . HEMANGIOMA EXCISION    . PARTIAL HYSTERECTOMY      FAMILY HISTORY: Family History  Problem Relation Age of Onset  . Fibromyalgia Mother   . Arthritis/Rheumatoid Mother   . Kidney failure Father   . Hypertension Father   . Lupus Father   . Fibromyalgia Sister   . Fibromyalgia Brother   . Epilepsy Brother     SOCIAL HISTORY:  Social History   Socioeconomic History  . Marital status: Legally Separated    Spouse name: Not on file  . Number of children: Not on file  . Years of education: Not on file  . Highest education level: Not on file  Occupational History  . Not on file  Tobacco Use  . Smoking status: Current Every Day Smoker  . Smokeless tobacco: Never Used  Substance and Sexual Activity  . Alcohol use: No  . Drug use: No  . Sexual activity: Not on file  Other Topics Concern  . Not on file  Social History Narrative  . Not on file   Social Determinants of Health   Financial Resource Strain:   . Difficulty of Paying Living Expenses:   Food Insecurity:   . Worried About Programme researcher, broadcasting/film/video in the Last Year:   . Engineer, site in the Last Year:   Transportation Needs:   . Freight forwarder (Medical):   Marland Kitchen Lack of Transportation (Non-Medical):   Physical Activity:   . Days of Exercise per Week:   . Minutes of Exercise per Session:   Stress:   . Feeling of Stress :   Social Connections:   . Frequency of Communication with Friends and Family:   . Frequency of Social Gatherings with Friends and Family:   . Attends Religious Services:   . Active Member of Clubs or Organizations:   . Attends Banker Meetings:   Marland Kitchen Marital Status:   Intimate Partner Violence:   . Fear of Current or Ex-Partner:   . Emotionally Abused:   Marland Kitchen Physically Abused:   . Sexually Abused:      PHYSICAL EXAM  Vitals:   11/23/19 1125  BP: 114/61  Pulse: 73  Temp: 98 F (36.7 C)  Weight: 121 lb (54.9 kg)  Height: 5\' 8"  (1.727 m)  Body mass index is 18.4 kg/m.   General: The patient is well-developed and well-nourished and in no acute distress  Musculoskeletal: She has tenderness over many of the classic fibromyalgia tender points of the neck, upper chest and back.  Tenderness is a little more in the paraspinal muscles of the left cervical region.    Neurologic Exam  Mental status: The patient is alert and oriented x 3 at the time of the examination. The patient has apparent normal recent and remote memory, with an apparently normal attention span and concentration ability.   Speech is normal.  Cranial nerves: Extraocular movements are full.  Facial strength is normal.  Trapezius strength is normal  No obvious hearing deficits are noted.  Motor:  Muscle bulk is normal.   Tone is normal. Strength is  5 / 5 in all 4 extremities.   Sensory: Sensory testing is intact to pinprick, soft touch and vibration sensation in the arms and legs. Coordination: Cerebellar testing reveals good finger-nose-finger and heel-to-shin bilaterally.  Gait and station: Station is normal.  Gait is normal and tandem gait  slightly wide..  Romberg is negative  Reflexes: Deep tendon reflexes are symmetric and increased bilaterally at 3 in arms and legs.  However, there is no spread at the knees or clonus at the ankles.       DIAGNOSTIC DATA (LABS, IMAGING, TESTING) - I reviewed patient records, labs, notes, testing and imaging myself where available.  Lab Results  Component Value Date   WBC 3.6 (L) 11/20/2009   HGB 10.8 (L) 11/20/2009   HCT 32.8 (L) 11/20/2009   MCV 81.3 11/20/2009   PLT 194 11/20/2009       ASSESSMENT AND PLAN  Fibromyalgia  Cervicalgia - Plan: MR CERVICAL SPINE WO CONTRAST  Status post lumbar surgery  Excessive daytime sleepiness  Cervical radiculopathy  Left arm weakness   1.   She is experiencing more neck pain and has pain into the left arm.  She has mild trouble raising the arm due to weakness and pain.  We will check an MRI of the cervical spine to determine if her degenerative changes have worsened.  Based on the results she may need a cervical ESI or referral to neurosurgery. 2.   Continue Lyrica and Cymbalta and add meloxicam 3.   She will return to see [place] in 6 months or sooner if there are new or worsening neurologic symptoms.   Estes Lehner A. Epimenio Foot, MD, PhD 11/23/2019, 12:08 PM Certified in Neurology, Clinical Neurophysiology, Sleep Medicine, Pain Medicine and Neuroimaging  [facility] [address] [place], [place] [address] [phone]

## 2019-11-27 NOTE — Telephone Encounter (Signed)
Pt LVM in regards to MRI order being sent to GI pt states she is wanting to be referred to a closer facility as she lives in [place], she would even consider the [place] area.

## 2019-11-28 NOTE — Telephone Encounter (Signed)
Drucie Opitz: NPR Ref # [id number] spoke to Amor. Sent the order to [facility] Eber Jones will reach out to the patient to schedule.

## 2019-12-01 NOTE — Telephone Encounter (Signed)
Patient is scheduled for 12/02/19.

## 2019-12-02 ENCOUNTER — Other Ambulatory Visit: Payer: Self-pay

## 2019-12-02 ENCOUNTER — Ambulatory Visit (HOSPITAL_BASED_OUTPATIENT_CLINIC_OR_DEPARTMENT_OTHER)
Admission: RE | Admit: 2019-12-02 | Discharge: 2019-12-02 | Disposition: A | Discharge status: Home / Self Care | Payer: 59 | Source: Ambulatory Visit | Attending: Neurology | Admitting: Neurology

## 2019-12-02 DIAGNOSIS — M542 Cervicalgia: Secondary | ICD-10-CM

## 2019-12-04 ENCOUNTER — Other Ambulatory Visit: Payer: Self-pay | Admitting: *Deleted

## 2019-12-04 MED ORDER — ARMODAFINIL 250 MG PO TABS: 250.0000 mg | ORAL_TABLET | Freq: Every day | ORAL | 1 refills | Status: DC

## 2020-04-09 DIAGNOSIS — Z0271 Encounter for disability determination: Secondary | ICD-10-CM

## 2020-05-26 ENCOUNTER — Encounter: Payer: Self-pay | Admitting: Family Medicine

## 2020-05-27 ENCOUNTER — Ambulatory Visit: Payer: Self-pay | Admitting: Family Medicine

## 2020-05-28 NOTE — Telephone Encounter (Signed)
Called, LVM for pt to call office back asap to discuss appt on hold for tomorrow.

## 2020-05-29 ENCOUNTER — Encounter: Payer: Self-pay | Admitting: Neurology

## 2020-05-29 ENCOUNTER — Ambulatory Visit: Payer: No Typology Code available for payment source | Admitting: Neurology

## 2020-05-29 VITALS — BP 108/61 | HR 78 | Ht 67.0 in | Wt 132.0 lb

## 2020-05-29 DIAGNOSIS — G4719 Other hypersomnia: Secondary | ICD-10-CM | POA: Diagnosis not present

## 2020-05-29 DIAGNOSIS — M542 Cervicalgia: Secondary | ICD-10-CM

## 2020-05-29 DIAGNOSIS — M797 Fibromyalgia: Secondary | ICD-10-CM | POA: Diagnosis not present

## 2020-05-29 DIAGNOSIS — I7300 Raynaud's syndrome without gangrene: Secondary | ICD-10-CM

## 2020-05-29 DIAGNOSIS — M25512 Pain in left shoulder: Secondary | ICD-10-CM | POA: Diagnosis not present

## 2020-05-29 MED ORDER — ARMODAFINIL 250 MG PO TABS: 250.0000 mg | ORAL_TABLET | Freq: Every day | ORAL | 5 refills | Status: DC

## 2020-05-29 NOTE — Progress Notes (Signed)
[facility]  PATIENT: Jessica Gomez DOB: [birth year]  REFERRING DOCTOR OR PCP:   Jessica Miner, PA-C ([facility]) SOURCE: Patient, notes from Jessica Gomez, PA_C  _________________________________   HISTORICAL  CHIEF COMPLAINT:  Chief Complaint  Patient presents with  . Follow-up    pt alone, rm 12 neck/shoulder's still hurting.    HISTORY OF PRESENT ILLNESS:  Jessica Gomez is a 39 year old woman with fibromyalgia, sleepiness, shoulder pain, hip pain and Raynaud's syndrome.  Update 05/29/2020: She is reporting more neck and shoulder pain.   She has more pain on the left.    Raising or external rotation of the left arm is painful in the shoulder.    She gets some numbness in her   MRI cervical spine 12/02/19 showed Shallow left paracentral disc protrusion at C5-6, potentially affecting the ventral left C6 nerve root. Overall, appearance is stable from previous.. Mild cervical spondylosis elsewhere without significant stenosis as above.   She never had cervical spine ESI.     FMS pain is better on Lyrica and duloxetine with some benefit.   She takes meloxicam at times.   Armodafinil has helped the mental fog and the excessive daytime sleepiness she tolerates well.  She has edema in her legs and has been referred to vascular.   She continues to note Raynaud's syndrome.  Vasculitis labs were normal earlier this year.  She had surgery in 2014 by Dr. Heath Lark at [facility].   She has no recent ESI.            REVIEW OF SYSTEMS: Constitutional: No fevers, chills, sweats, or change in appetite.   She notes fatigue, insomnia, restless legs Eyes: No visual changes, double vision, eye pain Ear, nose and throat: No hearing loss, ear pain, nasal congestion, sore throat Cardiovascular: She reports chest pain, palpitations, leg swelling Respiratory: She reports shortness of breath and snoring  GastrointestinaI: No nausea, vomiting, diarrhea, abdominal pain, fecal  incontinence Genitourinary: No dysuria, urinary retention or frequency.  No nocturia. Musculoskeletal: as above Integumentary: No rash,, skin lesions.  She notes itching. Neurological: as above Psychiatric: as above Endocrine: She reports falling hot or cold at times. She reports increased thirst. Hematologic/Lymphatic: No anemia, purpura, petechiae.    Allergic/Immunologic: No itchy/runny eyes, nasal congestion, recent allergic reactions, rashes  ALLERGIES: Allergies  Allergen Reactions  . Amoxicillin   . Phenergan [Promethazine Hcl] Rash    HOME MEDICATIONS:  Current Outpatient Medications:  .  Armodafinil 250 MG tablet, Take 1 tablet (250 mg total) by mouth daily. SEND TO [employer], pt uses [facility], Disp: 30 tablet, Rfl: 5 .  DULoxetine (CYMBALTA) 60 MG capsule, Take 60 mg by mouth daily. , Disp: , Rfl:  .  meloxicam (MOBIC) 7.5 MG tablet, Take 1 tablet (7.5 mg total) by mouth daily., Disp: 30 tablet, Rfl: 11 .  pregabalin (LYRICA) 150 MG capsule, Take 1 capsule (150 mg total) by mouth 3 (three) times daily., Disp: 270 capsule, Rfl: 1  PAST MEDICAL HISTORY: Past Medical History:  Diagnosis Date  . Fibromyalgia   . Headache   . Vision abnormalities     PAST SURGICAL HISTORY: Past Surgical History:  Procedure Laterality Date  . BACK SURGERY    . HEMANGIOMA EXCISION    . PARTIAL HYSTERECTOMY      FAMILY HISTORY: Family History  Problem Relation Age of Onset  . Fibromyalgia Mother   . Arthritis/Rheumatoid Mother   . Kidney failure Father   . Hypertension Father   .  Lupus Father   . Fibromyalgia Sister   . Fibromyalgia Brother   . Epilepsy Brother     SOCIAL HISTORY:  Social History   Socioeconomic History  . Marital status: Legally Separated    Spouse name: Not on file  . Number of children: Not on file  . Years of education: Not on file  . Highest education level: Not on file  Occupational History  . Not on file  Tobacco Use  . Smoking status:  Current Every Day Smoker  . Smokeless tobacco: Never Used  Substance and Sexual Activity  . Alcohol use: No  . Drug use: No  . Sexual activity: Not on file  Other Topics Concern  . Not on file  Social History Narrative  . Not on file   Social Determinants of Health   Financial Resource Strain:   . Difficulty of Paying Living Expenses: Not on file  Food Insecurity:   . Worried About Programme researcher, broadcasting/film/video in the Last Year: Not on file  . Ran Out of Food in the Last Year: Not on file  Transportation Needs:   . Lack of Transportation (Medical): Not on file  . Lack of Transportation (Non-Medical): Not on file  Physical Activity:   . Days of Exercise per Week: Not on file  . Minutes of Exercise per Session: Not on file  Stress:   . Feeling of Stress : Not on file  Social Connections:   . Frequency of Communication with Friends and Family: Not on file  . Frequency of Social Gatherings with Friends and Family: Not on file  . Attends Religious Services: Not on file  . Active Member of Clubs or Organizations: Not on file  . Attends Banker Meetings: Not on file  . Marital Status: Not on file  Intimate Partner Violence:   . Fear of Current or Ex-Partner: Not on file  . Emotionally Abused: Not on file  . Physically Abused: Not on file  . Sexually Abused: Not on file     PHYSICAL EXAM  Vitals:   05/29/20 1302  BP: 108/61  Pulse: 78  Weight: 132 lb (59.9 kg)  Height: 5\' 7"  (1.702 m)    Body mass index is 20.67 kg/m.   General: The patient is well-developed and well-nourished and in no acute distress  Musculoskeletal: She has tenderness over the subacromial bursa on the left.  No tenderness on the right.  No tenderness elsewhere in the shoulder.  She has tenderness over many of the classic fibromyalgia tender points of the neck, upper chest and back.  Tenderness more in the paraspinal muscles of the left cervical region.    Neurologic Exam  Mental status: The  patient is alert and oriented x 3 at the time of the examination. The patient has apparent normal recent and remote memory, with an apparently normal attention span and concentration ability.   Speech is normal.  Cranial nerves: Extraocular movements are full.  Facial strength is normal.  Trapezius strength is normal  No obvious hearing deficits are noted.  Motor:  Muscle bulk is normal.   Tone is normal. Strength is  5 / 5 in all 4 extremities.   Sensory: Sensory testing is intact to pinprick, soft touch and vibration sensation in the arms and legs.  Coordination: Cerebellar testing reveals good finger-nose-finger and heel-to-shin bilaterally.  Gait and station: Station is normal.  Gait is normal and tandem gait slightly wide..  Romberg is negative  Reflexes: Deep  tendon reflexes are symmetric and increased bilaterally at 3 in arms and legs.  However, there is no spread at the knees or clonus at the ankles.       DIAGNOSTIC DATA (LABS, IMAGING, TESTING) - I reviewed patient records, labs, notes, testing and imaging myself where available.  Lab Results  Component Value Date   WBC 3.6 (L) 11/20/2009   HGB 10.8 (L) 11/20/2009   HCT 32.8 (L) 11/20/2009   MCV 81.3 11/20/2009   PLT 194 11/20/2009       ASSESSMENT AND PLAN  Cervicalgia  Fibromyalgia  Excessive daytime sleepiness  Left shoulder pain, unspecified chronicity  Raynaud's disease without gangrene   1.   Left subacromial bursa injection with 30 mg Depomedrol in lidocaine using sterile technique.   Bilateral C6C7 paraspinal, left supraspinatus and left rhomboid TPI with 50 mg Depo-medrol in lidocaine.   Shoulder pain ws much better afterwards.  Consider cervical ESI if neck/left arm pain does not improve. 2.   Continue Lyrica and Cymbalta for fibromyalgia and radiculopathy.  Continue armodafinil for excessive daytime sleepiness. 3.   She will return to see [place] in 6 months or sooner if there are new or worsening  neurologic symptoms.   Jasilyn Holderman A. Epimenio Foot, MD, PhD 05/29/2020, 2:40 PM Certified in Neurology, Clinical Neurophysiology, Sleep Medicine, Pain Medicine and Neuroimaging  [facility] [address] [place], [place] [address] [phone]

## 2020-06-05 ENCOUNTER — Other Ambulatory Visit: Payer: Self-pay | Admitting: Neurology

## 2020-06-05 MED ORDER — ARMODAFINIL 250 MG PO TABS: 250.0000 mg | ORAL_TABLET | Freq: Every day | ORAL | 1 refills | Status: DC

## 2020-09-04 ENCOUNTER — Telehealth: Payer: Self-pay | Admitting: Neurology

## 2020-09-04 NOTE — Telephone Encounter (Signed)
Left a voice mail for the pt stating that a fee has to be paid before we will complete her Francesco Sor form and advised her to call the office to pay via phone.

## 2020-09-19 ENCOUNTER — Telehealth: Payer: Self-pay | Admitting: *Deleted

## 2020-09-19 NOTE — Telephone Encounter (Signed)
Pt form on [employer]

## 2020-10-10 NOTE — Telephone Encounter (Signed)
Gave completed/signed form back to medical records to process for pt. 

## 2020-11-05 DIAGNOSIS — Z0271 Encounter for disability determination: Secondary | ICD-10-CM

## 2020-11-27 ENCOUNTER — Ambulatory Visit: Payer: Self-pay | Admitting: Neurology

## 2020-12-05 ENCOUNTER — Other Ambulatory Visit: Payer: Self-pay | Admitting: Neurology

## 2021-01-13 ENCOUNTER — Other Ambulatory Visit: Payer: Self-pay | Admitting: Neurology

## 2021-02-18 ENCOUNTER — Ambulatory Visit: Payer: Medicaid Other | Admitting: Neurology

## 2021-03-15 ENCOUNTER — Other Ambulatory Visit: Payer: Self-pay | Admitting: Neurology

## 2021-03-24 ENCOUNTER — Other Ambulatory Visit: Payer: Self-pay | Admitting: Neurology

## 2021-03-28 ENCOUNTER — Other Ambulatory Visit: Payer: Self-pay | Admitting: Neurology

## 2021-05-13 ENCOUNTER — Ambulatory Visit: Payer: Medicaid Other | Admitting: Neurology

## 2021-05-13 ENCOUNTER — Encounter: Payer: Self-pay | Admitting: Neurology

## 2021-05-13 VITALS — BP 114/68 | HR 67 | Ht 67.0 in | Wt 120.0 lb

## 2021-05-13 DIAGNOSIS — M25512 Pain in left shoulder: Secondary | ICD-10-CM | POA: Diagnosis not present

## 2021-05-13 DIAGNOSIS — M542 Cervicalgia: Secondary | ICD-10-CM

## 2021-05-13 DIAGNOSIS — M5412 Radiculopathy, cervical region: Secondary | ICD-10-CM

## 2021-05-13 DIAGNOSIS — M797 Fibromyalgia: Secondary | ICD-10-CM

## 2021-05-13 DIAGNOSIS — I7300 Raynaud's syndrome without gangrene: Secondary | ICD-10-CM | POA: Diagnosis not present

## 2021-05-13 MED ORDER — ARMODAFINIL 250 MG PO TABS: ORAL_TABLET | ORAL | 1 refills | Status: DC
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
## 2022-05-13 ENCOUNTER — Ambulatory Visit (INDEPENDENT_AMBULATORY_CARE_PROVIDER_SITE_OTHER): Payer: Medicaid Other | Admitting: Neurology

## 2022-05-13 ENCOUNTER — Encounter: Payer: Self-pay | Admitting: Neurology

## 2022-05-13 VITALS — BP 123/81 | HR 84 | Ht 67.0 in | Wt 131.0 lb

## 2022-05-13 DIAGNOSIS — Z9889 Other specified postprocedural states: Secondary | ICD-10-CM

## 2022-05-13 DIAGNOSIS — M25512 Pain in left shoulder: Secondary | ICD-10-CM

## 2022-05-13 DIAGNOSIS — M542 Cervicalgia: Secondary | ICD-10-CM | POA: Diagnosis not present

## 2022-05-13 DIAGNOSIS — G8929 Other chronic pain: Secondary | ICD-10-CM | POA: Diagnosis not present

## 2022-05-13 DIAGNOSIS — M5412 Radiculopathy, cervical region: Secondary | ICD-10-CM

## 2022-05-13 DIAGNOSIS — M5417 Radiculopathy, lumbosacral region: Secondary | ICD-10-CM | POA: Diagnosis not present

## 2022-05-13 DIAGNOSIS — R29898 Other symptoms and signs involving the musculoskeletal system: Secondary | ICD-10-CM | POA: Diagnosis not present

## 2022-05-13 DIAGNOSIS — M797 Fibromyalgia: Secondary | ICD-10-CM | POA: Diagnosis not present
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
## 2022-05-22 ENCOUNTER — Telehealth: Payer: Self-pay | Admitting: Neurology

## 2022-05-22 NOTE — Telephone Encounter (Signed)
medicare/healthy blue Berkley Harvey: [id number] exp. 05/22/22-07/20/22 sent to [facility]

## 2022-11-16 ENCOUNTER — Telehealth: Payer: Self-pay | Admitting: Neurology

## 2022-11-16 ENCOUNTER — Telehealth (INDEPENDENT_AMBULATORY_CARE_PROVIDER_SITE_OTHER): Payer: Medicare Other | Admitting: Neurology

## 2022-11-16 ENCOUNTER — Encounter: Payer: Self-pay | Admitting: Neurology

## 2022-11-16 DIAGNOSIS — Z9889 Other specified postprocedural states: Secondary | ICD-10-CM

## 2022-11-16 DIAGNOSIS — M5417 Radiculopathy, lumbosacral region: Secondary | ICD-10-CM

## 2022-11-16 MED ORDER — LEVETIRACETAM 500 MG PO TABS: 500.0000 mg | ORAL_TABLET | Freq: Two times a day (BID) | ORAL | 11 refills | Status: AC

## 2022-11-16 NOTE — Progress Notes (Signed)
[facility]  PATIENT: Jessica Gomez DOB: 1981-03-03  REFERRING DOCTOR OR PCP:   Dorothy Puffer, PA-C ([facility]) SOURCE: Patient, notes from Ms. Mikki Santee, PA_C  _________________________________   HISTORICAL  CHIEF COMPLAINT:  Chief Complaint  Patient presents with   Follow-up    RM 1, alone. Last seen 05/13/21. Pt reports back pain on left side, goes down leg. Pain on left side. Left foot drop, unable to stand on it intermittently.     Virtual Visit via Video Note I connected with Jessica Gomez  on 11/16/22 at 11:30 AM EST by a video enabled telemedicine application and verified that I am speaking with the correct person.  I discussed the limitations of evaluation and management by telemedicine and the availability of in person appointments. The patient expressed understanding and agreed to proceed.  Patient at home; provider in office   HISTORY OF PRESENT ILLNESS:  Jessica Gomez is a 42 year-old woman with fibromyalgia, sleepiness, shoulder pain, hip pain and Raynaud's syndrome.   Update 11/16/2022: She has pain in her neck, shoulder and legs .  She notes the pain is worse on left side.     Pain is deep - in muscles/bones. Her worse pain is in the LB and into the hip and proximal leg.   She notes more numbness in her toes  She never was able to get the lumbar spine MRI as her insurance changed,.   She had surgery in 2014 by Dr. Kennon Holter at L4L5.   She has no recent ESI.       FMS pain has generally  done better on Lyrica 150 mg po tid and duloxetine 60 mg  She takes meloxicam at times.     Subacromial bursa injection helped her for many months but is starting to bother her again.  She has fatigue and cognitive fog  Both are greatly helped by Armodafinil   She tolerates it well  She has edema and pain in her feet ,   She has numbness worse than last year.   She continues to note Raynaud's syndrome.   Cold worses the foot symptoms.  .  Vasculitis  labs were normal earlier this year.    She has noted jerks that occur any time of day.  Not associated with drowsiness  MRI cervical spine 12/02/19 showed Shallow left paracentral disc protrusion at C5-6, potentially affecting the ventral left C6 nerve root. Overall, appearance is stable from previous.. Mild cervical spondylosis elsewhere without significant stenosis as above.   She never had cervical spine ESI.     MRI lumbar spine 10/06/2018 showed  1. Moderate spinal stenosis due to disc herniation, facet hypertrophy and ligamenta flava hypertrophy at L4-L5.  There is severe bilateral lateral recess stenosis that could lead to compression of either of the L5 nerve roots.  Additionally there is mild to moderate left foraminal narrowing.   Of note, this has progressed compared to the 2014 CT myelogram when protruding disc was only towards the right and there was no left lateral recess or foraminal narrowing. 2.   Other degenerative changes at L3-L4 and L5-S1 that do not lead to nerve root compression. 3.   Transitional anatomy.    PE: She is a well-developed well-nourished woman in no acute distress.  The head is normocephalic and atraumatic.  Sclera are anicteric.  Visible skin appears normal.  The neck has a good range of motion.   She is alert and fully oriented  with fluent speech and good attention, knowledge and memory.  Extraocular muscles are intact.  Facial strength is normal.    She appears to have normal strength in the arms.  Rapid alternating movements and finger-nose-finger are performed well.   REVIEW OF SYSTEMS: Constitutional: No fevers, chills, sweats, or change in appetite.   She notes fatigue, insomnia, restless legs Eyes: No visual changes, double vision, eye pain Ear, nose and throat: No hearing loss, ear pain, nasal congestion, sore throat Cardiovascular: She reports chest pain, palpitations, leg swelling Respiratory:  She reports shortness of breath and snoring   GastrointestinaI: No nausea, vomiting, diarrhea, abdominal pain, fecal incontinence Genitourinary:  No dysuria, urinary retention or frequency.  No nocturia. Musculoskeletal:  as above Integumentary: No rash,, skin lesions.  She notes itching. Neurological: as above Psychiatric: as above Endocrine: She reports falling hot or cold at times. She reports increased thirst. Hematologic/Lymphatic:  No anemia, purpura, petechiae.    Allergic/Immunologic: No itchy/runny eyes, nasal congestion, recent allergic reactions, rashes  ALLERGIES: Allergies  Allergen Reactions   Amoxicillin    Phenergan [Promethazine Hcl] Rash    HOME MEDICATIONS:  Current Outpatient Medications:    Armodafinil 250 MG tablet, Take 1 tablet by mouth one time a day., Disp: 90 tablet, Rfl: 1   DULoxetine (CYMBALTA) 60 MG capsule, Take 60 mg by mouth daily. , Disp: , Rfl:    meloxicam (MOBIC) 7.5 MG tablet, Take 1 tablet (7.5 mg total) by mouth daily., Disp: 30 tablet, Rfl: 11   pregabalin (LYRICA) 150 MG capsule, Take 1 capsule (150 mg total) by mouth 3 (three) times daily., Disp: 270 capsule, Rfl: 1  PAST MEDICAL HISTORY: Past Medical History:  Diagnosis Date   Fibromyalgia    Headache    Vision abnormalities     PAST SURGICAL HISTORY: Past Surgical History:  Procedure Laterality Date   BACK SURGERY     HEMANGIOMA EXCISION     PARTIAL HYSTERECTOMY      FAMILY HISTORY: Family History  Problem Relation Age of Onset   Fibromyalgia Mother    Arthritis/Rheumatoid Mother    Kidney failure Father    Hypertension Father    Lupus Father    Fibromyalgia Sister    Fibromyalgia Brother    Epilepsy Brother     SOCIAL HISTORY:  Social History   Socioeconomic History   Marital status: Legally Separated    Spouse name: Not on file   Number of children: Not on file   Years of education: Not on file   Highest education level: Not on file  Occupational History   Not on file  Tobacco Use   Smoking  status: Every Day   Smokeless tobacco: Never  Substance and Sexual Activity   Alcohol use: No   Drug use: No   Sexual activity: Not on file  Other Topics Concern   Not on file  Social History Narrative   Not on file   Social Determinants of Health   Financial Resource Strain: Not on file  Food Insecurity: Not on file  Transportation Needs: Not on file  Physical Activity: Not on file  Stress: Not on file  Social Connections: Not on file  Intimate Partner Violence: Not on file     PHYSICAL EXAM from 05/13/2022  Vitals:   05/13/22 1130  BP: 123/81  Pulse: 84  Weight: 131 lb (59.4 kg)  Height: '5\' 7"'$  (1.702 m)    Body mass index is 20.52 kg/m.   General: The patient is  well-developed and well-nourished and in no acute distress  Musculoskeletal: She has tenderness over the left subacromial bursa with reduced range of motion at the shoulder. No tenderness on the right.  No tenderness elsewhere in the shoulder.  She has tenderness over many of the classic fibromyalgia tender points of the neck, upper chest and back.  She also has tenderness more in the  lowerparaspinal muscles of the left cervical region, left trpaezius and rhomboid.     Extremities/skin:   Mild edema bilaterally.    Neurologic Exam  Mental status: The patient is alert and oriented x 3 at the time of the examination. The patient has apparent normal recent and remote memory, with an apparently normal attention span and concentration ability.   Speech is normal.  Cranial nerves: Extraocular movements are full.  Facial strength is normal.  Trapezius strength is normal  No obvious hearing deficits are noted.  Motor:  Muscle bulk is normal.   Tone is normal. Strength is  5 / 5 in all 4 extremities.   Sensory: Sensory testing is intact to pinprick, soft touch and vibration sensation in the arms and legs.  Coordination: Cerebellar testing reveals good finger-nose-finger and heel-to-shin bilaterally.  Gait and  station: Station is normal.  Gait is normal and tandem gait slightly wide..  Romberg is negative  Reflexes: Deep tendon reflexes are symmetric and increased bilaterally at 3 in arms and legs.  However, there is no spread at the knees or clonus at the ankles.       DIAGNOSTIC DATA (LABS, IMAGING, TESTING) - I reviewed patient records, labs, notes, testing and imaging myself where available.  Lab Results  Component Value Date   WBC 3.6 (L) 11/20/2009   HGB 10.8 (L) 11/20/2009   HCT 32.8 (L) 11/20/2009   MCV 81.3 11/20/2009   PLT 194 11/20/2009       ASSESSMENT AND PLAN  Lumbosacral radiculopathy at L5 - Plan: MR Lumbar Spine W Wo Contrast  Left arm weakness  Fibromyalgia  Cervical radiculopathy  Chronic left shoulder pain  History of lumbar surgery - Plan: MR Lumbar Spine W Wo Contrast  Cervicalgia   1.   LB/hip pain is worse.   MRI in 2020 had shown significant degenerative changes at L4-L5.  These may have worsened causing additional symptoms to the left.  We will check MRI of the lumbar spine for lumbar radicular symptoms.  If DJD has owrsened,  she may need an epidural steroid injection or referral to surgery..   2.  If the shoulder worsens, consider repeat bursa injection or ortho referral.   3.   Continue Lyrica and Cymbalta for fibromyalgia and radiculopathy.  Continue armodafinil for excessive daytime sleepiness (psych prescribes). 4.  Levetiracetam for jerks.  If worsen, consider cervical spine MRi.   5.   She will return to see [place] in 6 months or sooner if there are new or worsening neurologic symptoms or based on results.   Follow Up Instructions: I discussed the assessment and treatment plan with the patient. The patient was provided an opportunity to ask questions and all were answered. The patient agreed with the plan and demonstrated an understanding of the instructions.    The patient was advised to call back or seek an in-person evaluation if the symptoms  worsen or if the condition fails to improve as anticipated.  I provided 25 minutes of non-face-to-face time during this encounter.   Blaklee Shores A. Felecia Shelling, MD, PhD [id number], [id number] PM Certified in  Neurology, Clinical Neurophysiology, Sleep Medicine, Pain Medicine and Neuroimaging  [facility] [address], [place], [place] [address] [phone]

## 2022-11-16 NOTE — Telephone Encounter (Signed)
..   Pt understands that although there may be some limitations with this type of visit, we will take all precautions to reduce any security or privacy concerns.  Pt understands that this will be treated like an in office visit and we will file with pt's insurance, and there may be a patient responsible charge related to this service. ? ?

## 2022-11-17 ENCOUNTER — Telehealth: Payer: Self-pay | Admitting: Neurology

## 2022-11-17 NOTE — Telephone Encounter (Signed)
medicare/medicaid NPR sent to [facility] [phone]

## 2022-11-18 ENCOUNTER — Encounter: Payer: Self-pay | Admitting: Neurology

## 2023-08-16 ENCOUNTER — Encounter: Payer: Self-pay | Admitting: Neurology

## 2023-08-16 ENCOUNTER — Ambulatory Visit: Payer: Medicare Other | Admitting: Neurology

## 2023-11-04 ENCOUNTER — Encounter: Payer: Self-pay | Admitting: Neurology

## 2023-11-23 ENCOUNTER — Ambulatory Visit: Payer: Medicare Other | Admitting: Neurology

## 2024-02-01 ENCOUNTER — Ambulatory Visit (INDEPENDENT_AMBULATORY_CARE_PROVIDER_SITE_OTHER): Payer: Medicare Other | Admitting: Neurology

## 2024-02-01 ENCOUNTER — Encounter: Payer: Self-pay | Admitting: Neurology

## 2024-02-01 VITALS — BP 145/86 | HR 65 | Ht 67.0 in | Wt 131.5 lb

## 2024-02-01 DIAGNOSIS — M25512 Pain in left shoulder: Secondary | ICD-10-CM | POA: Diagnosis not present

## 2024-02-01 DIAGNOSIS — M5412 Radiculopathy, cervical region: Secondary | ICD-10-CM | POA: Diagnosis not present

## 2024-02-01 DIAGNOSIS — M542 Cervicalgia: Secondary | ICD-10-CM

## 2024-02-01 DIAGNOSIS — Z9889 Other specified postprocedural states: Secondary | ICD-10-CM | POA: Diagnosis not present

## 2024-02-01 DIAGNOSIS — M5417 Radiculopathy, lumbosacral region: Secondary | ICD-10-CM

## 2024-02-01 DIAGNOSIS — G8929 Other chronic pain: Secondary | ICD-10-CM

## 2024-02-01 DIAGNOSIS — M7552 Bursitis of left shoulder: Secondary | ICD-10-CM | POA: Diagnosis not present

## 2024-02-01 DIAGNOSIS — M25521 Pain in right elbow: Secondary | ICD-10-CM

## 2024-02-01 DIAGNOSIS — M7551 Bursitis of right shoulder: Secondary | ICD-10-CM | POA: Diagnosis not present

## 2024-02-01 DIAGNOSIS — M797 Fibromyalgia: Secondary | ICD-10-CM

## 2024-02-01 DIAGNOSIS — R292 Abnormal reflex: Secondary | ICD-10-CM

## 2024-02-01 MED ORDER — METHYLPREDNISOLONE ACETATE 80 MG/ML IJ SUSP
80.0000 mg | Freq: Once | INTRAMUSCULAR | Status: AC
  Administered 2024-02-01: 80 mg via INTRAMUSCULAR

## 2024-02-01 MED ORDER — BUPIVACAINE HCL 0.5 % IJ SOLN: 4.0000 mL | Freq: Once | INTRAMUSCULAR | Status: AC

## 2024-02-01 MED ORDER — ARMODAFINIL 250 MG PO TABS: ORAL_TABLET | ORAL | 1 refills | Status: DC

## 2024-02-01 NOTE — Progress Notes (Signed)
 [facility]  PATIENT: Jessica Gomez DOB: [birth year]  REFERRING DOCTOR OR PCP:   Devyn Walke, PA-C ([facility]) SOURCE: Patient, notes from Ms. Ezra Holmes, PA_C  _________________________________   HISTORICAL  CHIEF COMPLAINT:  Chief Complaint  Patient presents with   RM10/F/U    Pt is here Alone. Pt states that both of her arms is having pain and weakness. Pt states that she is having burning sensation in both of her arms. Pt states that everything else is about the same.     HISTORY OF PRESENT ILLNESS:  Jessica Gomez is a 43 year-old woman with fibromyalgia, sleepiness, shoulder pain, hip pain and Raynaud's syndrome.  Update 02/01/2024: She has pain in her neck, shoulder and legs .  The left neck and shoulder are worse than right     Pain is deep - in muscles/bones.   She notes the LBP is worse again.   She also has pain going into the hip and proximal leg.     She never was able to get the lumbar spine MRI as her insurance changed,.   She had surgery in 2014 by Dr. Blane Bunting at L4L5.   She has no recent ESI.       Subacromial bursa injection helped her for many months..  FMS pain has generally done ok on Lyrica  150 mg po tid and duloxetine 60 mg  She takes meloxicam  at times.    She has fatigue and cognitive fog  Both are greatly helped by Armodafinil    She tolerates it well  She has edema and pain in her feet.   She continues to note Raynaud's syndrome.   Cold worses the foot symptoms.  .  Vasculitis labs were normal earlier this year.    She has noted jerks that occur any time of day.  Not associated with drowsiness  She tries to stay active and does some stretching exercises.     IMAGING MRI cervical spine 12/02/19 showed Shallow left paracentral disc protrusion at C5-6, potentially affecting the ventral left C6 nerve root. Overall, appearance is stable from previous.. Mild cervical spondylosis elsewhere without significant stenosis as above.    She never had cervical spine ESI.     MRI lumbar spine 10/06/2018 showed  1. Moderate spinal stenosis due to disc herniation, facet hypertrophy and ligamenta flava hypertrophy at L4-L5.  There is severe bilateral lateral recess stenosis that could lead to compression of either of the L5 nerve roots.  Additionally there is mild to moderate left foraminal narrowing.   Of note, this has progressed compared to the 2014 CT myelogram when protruding disc was only towards the right and there was no left lateral recess or foraminal narrowing. 2.   Other degenerative changes at L3-L4 and L5-S1 that do not lead to nerve root compression. 3.   Transitional anatomy.    MRI head 02/01/2020 was reportedly normal.    REVIEW OF SYSTEMS: Constitutional: No fevers, chills, sweats, or change in appetite.   She notes fatigue, insomnia, restless legs Eyes: No visual changes, double vision, eye pain Ear, nose and throat: No hearing loss, ear pain, nasal congestion, sore throat Cardiovascular: She reports chest pain, palpitations, leg swelling Respiratory:  She reports shortness of breath and snoring  GastrointestinaI: No nausea, vomiting, diarrhea, abdominal pain, fecal incontinence Genitourinary:  No dysuria, urinary retention or frequency.  No nocturia. Musculoskeletal:  as above Integumentary: No rash,, skin lesions.  She notes itching. Neurological: as above Psychiatric: as above  Endocrine: She reports falling hot or cold at times. She reports increased thirst. Hematologic/Lymphatic:  No anemia, purpura, petechiae.    Allergic/Immunologic: No itchy/runny eyes, nasal congestion, recent allergic reactions, rashes  ALLERGIES: Allergies  Allergen Reactions   Amoxicillin    Phenergan [Promethazine Hcl] Rash    HOME MEDICATIONS:  Current Outpatient Medications:    DULoxetine (CYMBALTA) 60 MG capsule, Take 60 mg by mouth daily. , Disp: , Rfl:    meloxicam  (MOBIC ) 7.5 MG tablet, Take 1 tablet (7.5 mg  total) by mouth daily., Disp: 30 tablet, Rfl: 11   pregabalin  (LYRICA ) 150 MG capsule, Take 1 capsule (150 mg total) by mouth 3 (three) times daily., Disp: 270 capsule, Rfl: 1   Armodafinil  250 MG tablet, Take 1 tablet by mouth one time a day., Disp: 90 tablet, Rfl: 1   levETIRAcetam  (KEPPRA ) 500 MG tablet, Take 1 tablet (500 mg total) by mouth 2 (two) times daily. (Patient not taking: Reported on 02/01/2024), Disp: 60 tablet, Rfl: 11  Current Facility-Administered Medications:    bupivacaine (MARCAINE) 0.5 % (with pres) injection 4 mL, 4 mL, Infiltration, Once, Larita Deremer, Sherida Dimmer, MD  PAST MEDICAL HISTORY: Past Medical History:  Diagnosis Date   Fibromyalgia    Headache    Vision abnormalities     PAST SURGICAL HISTORY: Past Surgical History:  Procedure Laterality Date   BACK SURGERY     HEMANGIOMA EXCISION     PARTIAL HYSTERECTOMY      FAMILY HISTORY: Family History  Problem Relation Age of Onset   Fibromyalgia Mother    Arthritis/Rheumatoid Mother    Kidney failure Father    Hypertension Father    Lupus Father    Fibromyalgia Sister    Fibromyalgia Brother    Epilepsy Brother     SOCIAL HISTORY:  Social History   Socioeconomic History   Marital status: Legally Separated    Spouse name: Not on file   Number of children: Not on file   Years of education: Not on file   Highest education level: Not on file  Occupational History   Not on file  Tobacco Use   Smoking status: Every Day   Smokeless tobacco: Never  Substance and Sexual Activity   Alcohol use: No   Drug use: No   Sexual activity: Not on file  Other Topics Concern   Not on file  Social History Narrative   Not on file   Social Drivers of Health   Financial Resource Strain: Not on file  Food Insecurity: Low Risk  (06/04/2023)   Received from [facility]   Hunger Vital Sign    Worried About Running Out of Food in the Last Year: Never true    Ran Out of Food in the Last Year: Patient declined to  answer  Transportation Needs: Not on file (06/04/2023)  Physical Activity: Not on file  Stress: Not on file  Social Connections: Unknown (01/16/2022)   Received from [facility], [facility]   Social Network    Social Network: Not on file  Intimate Partner Violence: Unknown (12/17/2021)   Received from [facility], [facility]   HITS    Physically Hurt: Not on file    Insult or Talk Down To: Not on file    Threaten Physical Harm: Not on file    Scream or Curse: Not on file     PHYSICAL EXAM  Vitals:   02/01/24 1311  BP: (!) 145/86  Pulse: 65  Weight: 131 lb 8  oz (59.6 kg)  Height: 5\' 7"  (1.702 m)     Body mass index is 20.6 kg/m.   General: The patient is well-developed and well-nourished and in no acute distress  Musculoskeletal: She has tenderness bilaterally over the subacromial bursae, left greater than right.  Range of motion is reduced at the shoulders.  No tenderness over the glenohumeral joints.  She also has tenderness over some of the lower cervical paraspinal muscles and the piriformis muscles.    She has tenderness over many of the classic fibromyalgia tender points of the neck, upper chest and back.   Extremities/skin:   Mild edema bilaterally.    Neurologic Exam  Mental status: The patient is alert and oriented x 3 at the time of the examination. The patient has apparent normal recent and remote memory, with an apparently normal attention span and concentration ability.   Speech is normal.  Cranial nerves: Extraocular movements are full.  Facial strength is normal.  Trapezius strength is normal  No obvious hearing deficits are noted.  Motor:  Muscle bulk is normal.   Tone is normal. Strength is  5 / 5 in all 4 extremities.   Sensory: Sensory testing is intact to pinprick, soft touch and vibration sensation in the arms and legs.  Coordination: Cerebellar testing reveals good finger-nose-finger and heel-to-shin bilaterally.  Gait and station: Station  is normal.  Gait is normal and tandem gait slightly wide..  Romberg is negative  Reflexes: Deep tendon reflexes are symmetric and increased bilaterally at 3 in arms and legs.  There is some spread at the knees.  No ankle clonus.   DIAGNOSTIC DATA (LABS, IMAGING, TESTING) - I reviewed patient records, labs, notes, testing and imaging myself where available.  Lab Results  Component Value Date   WBC 3.6 (L) 11/20/2009   HGB 10.8 (L) 11/20/2009   HCT 32.8 (L) 11/20/2009   MCV 81.3 11/20/2009   PLT 194 11/20/2009       ASSESSMENT AND PLAN  Lumbosacral radiculopathy at L5 - Plan: MR Lumbar Spine W Wo Contrast, bupivacaine (MARCAINE) 0.5 % (with pres) injection 4 mL, methylPREDNISolone  acetate (DEPO-MEDROL ) injection 80 mg  Status post lumbar surgery - Plan: MR Lumbar Spine W Wo Contrast  Fibromyalgia  Cervical radiculopathy - Plan: MR CERVICAL SPINE WO CONTRAST  Chronic left shoulder pain - Plan: MR CERVICAL SPINE WO CONTRAST  Subacromial bursitis of both shoulders  Right elbow pain - Plan: MR CERVICAL SPINE WO CONTRAST  Hyperreflexia - Plan: MR CERVICAL SPINE WO CONTRAST   1.   Bilateral subacromial bursa injection with 25 mg x 2 (right/left) Depomedrol in lidocaine using sterile technique.   Bilateral C7 paraspinal and left trapezius TPI with 30 mg Depo-medrol  in lidocaine using sterile technique.   Several minutes later, she had reduced pain and improved range of motion in the shoulders. 2.   Continue Lyrica  and Cymbalta for fibromyalgia and radiculopathy.  Continue armodafinil  for excessive daytime sleepiness. 3.   She is noticing more lower back pain and more sciatic type pain.   Lumbar MRI in 2020 had shown significant degenerative changes at L4-L5 with moderate spinal stenosis.  Due to worsening symptoms we will check an MRI of the lumbar spine.  Based on these results she may need an epidural steroid injection or referral to surgery..  Due to the neck and arm pain and  hyperreflexia, we will check an MRI of the cervical spine 4.  She will return to see us  in 6 months  or sooner if there are new or worsening neurologic symptoms or based on results.   Rosellen Lichtenberger A. Godwin Lat, MD, PhD 02/01/2024, 7:54 PM Certified in Neurology, Clinical Neurophysiology, Sleep Medicine, Pain Medicine and Neuroimaging  [facility] [address], [address] [place], [place] [address] [phone]

## 2024-02-03 ENCOUNTER — Telehealth: Payer: Self-pay | Admitting: Neurology

## 2024-02-03 NOTE — Telephone Encounter (Signed)
 no auth required sent to [facility] [phone]

## 2024-03-09 ENCOUNTER — Encounter: Payer: Self-pay | Admitting: Neurology

## 2024-03-12 ENCOUNTER — Ambulatory Visit
Admission: RE | Admit: 2024-03-12 | Discharge: 2024-03-12 | Disposition: A | Discharge status: Home / Self Care | Source: Ambulatory Visit | Attending: Neurology | Admitting: Neurology

## 2024-03-12 DIAGNOSIS — R292 Abnormal reflex: Secondary | ICD-10-CM

## 2024-03-12 DIAGNOSIS — Z9889 Other specified postprocedural states: Secondary | ICD-10-CM

## 2024-03-12 DIAGNOSIS — M5417 Radiculopathy, lumbosacral region: Secondary | ICD-10-CM | POA: Diagnosis not present

## 2024-03-12 DIAGNOSIS — M25521 Pain in right elbow: Secondary | ICD-10-CM

## 2024-03-12 DIAGNOSIS — M25512 Pain in left shoulder: Secondary | ICD-10-CM | POA: Diagnosis not present

## 2024-03-12 DIAGNOSIS — M5412 Radiculopathy, cervical region: Secondary | ICD-10-CM | POA: Diagnosis not present

## 2024-03-12 DIAGNOSIS — G8929 Other chronic pain: Secondary | ICD-10-CM

## 2024-03-12 MED ORDER — GADOPICLENOL 0.5 MMOL/ML IV SOLN
7.0000 mL | Freq: Once | INTRAVENOUS | Status: AC | PRN
Start: 2024-03-12 — End: 2024-03-12
  Administered 2024-03-12: 7 mL via INTRAVENOUS

## 2024-03-13 ENCOUNTER — Encounter: Payer: Self-pay | Admitting: *Deleted

## 2024-03-13 ENCOUNTER — Ambulatory Visit: Payer: Self-pay | Admitting: Neurology

## 2024-09-23 ENCOUNTER — Other Ambulatory Visit: Payer: Self-pay | Admitting: Neurology

## 2024-09-25 NOTE — Telephone Encounter (Signed)
 Requested Prescriptions   Pending Prescriptions Disp Refills   Armodafinil  250 MG tablet [Pharmacy Med Name: ARMODAFINIL  250 MG TABLET] 90 tablet     Sig: TAKE 1 TABLET BY MOUTH EVERY DAY   Last seen 02/01/24 Next appt not scheduled   Dispenses   Dispensed Days Supply Quantity Provider Pharmacy  armodafinil  250 mg tablet 09/04/2024 30 30 tablet  [facility]...  ARMODAFINIL  250 MG TABLET 06/25/2024 90 90 each Vear Charlie LABOR, MD [facility] [phone] - T...  ARMODAFINIL  250 MG TABLET 03/07/2024 90 90 each Vear Charlie LABOR, MD [facility] [phone] - T...  ARMODAFINIL  250 MG TABLET 02/02/2024 30 30 each O'Leary-Peck, Christina D, PA-C [facility] [phone] - T...  ARMODAFINIL  250 MG TABLET 01/06/2024 30 30 each O'Leary-Peck, Christina D, PA-C [facility] [phone] - T...  ARMODAFINIL  250 MG TAB 11/21/2023 30 30 each O'Leary-Peck, Christina D, PA-C [facility]...  ARMODAFINIL  250 MG TAB 10/14/2023 30 30 each [facility][email or web address]
